# Patient Record
Sex: Female | Born: 1960 | Race: White | Hispanic: No | Marital: Married | Smoking: Never smoker
Health system: Southern US, Community
[De-identification: ages and names within clinical notes are randomized; demographics above are authoritative.]

## PROBLEM LIST (undated history)

## (undated) DIAGNOSIS — M797 Fibromyalgia: Secondary | ICD-10-CM

## (undated) DIAGNOSIS — Z8489 Family history of other specified conditions: Secondary | ICD-10-CM

## (undated) DIAGNOSIS — K146 Glossodynia: Principal | ICD-10-CM

## (undated) DIAGNOSIS — N816 Rectocele: Secondary | ICD-10-CM

## (undated) DIAGNOSIS — K37 Unspecified appendicitis: Secondary | ICD-10-CM

## (undated) DIAGNOSIS — Z87442 Personal history of urinary calculi: Secondary | ICD-10-CM

## (undated) DIAGNOSIS — E039 Hypothyroidism, unspecified: Secondary | ICD-10-CM

## (undated) DIAGNOSIS — F909 Attention-deficit hyperactivity disorder, unspecified type: Secondary | ICD-10-CM

## (undated) DIAGNOSIS — F32A Depression, unspecified: Secondary | ICD-10-CM

## (undated) DIAGNOSIS — F419 Anxiety disorder, unspecified: Secondary | ICD-10-CM

## (undated) DIAGNOSIS — M1990 Unspecified osteoarthritis, unspecified site: Secondary | ICD-10-CM

## (undated) DIAGNOSIS — G43009 Migraine without aura, not intractable, without status migrainosus: Secondary | ICD-10-CM

## (undated) DIAGNOSIS — K219 Gastro-esophageal reflux disease without esophagitis: Secondary | ICD-10-CM

## (undated) DIAGNOSIS — N189 Chronic kidney disease, unspecified: Secondary | ICD-10-CM

## (undated) DIAGNOSIS — E782 Mixed hyperlipidemia: Secondary | ICD-10-CM

## (undated) HISTORY — DX: Glossodynia: K14.6

## (undated) HISTORY — DX: Anxiety disorder, unspecified: F41.9

## (undated) HISTORY — DX: Unspecified appendicitis: K37

## (undated) HISTORY — DX: Chronic kidney disease, unspecified: N18.9

## (undated) HISTORY — DX: Fibromyalgia: M79.7

## (undated) HISTORY — PX: LITHOTRIPSY: SUR834

## (undated) HISTORY — DX: Unspecified osteoarthritis, unspecified site: M19.90

## (undated) HISTORY — PX: APPENDECTOMY: SHX54

## (undated) HISTORY — DX: Migraine without aura, not intractable, without status migrainosus: G43.009

## (undated) HISTORY — DX: Rectocele: N81.6

---

## 1997-12-12 ENCOUNTER — Inpatient Hospital Stay (HOSPITAL_COMMUNITY): Admission: AD | Admit: 1997-12-12 | Discharge: 1997-12-13 | Payer: Self-pay | Admitting: Gynecology

## 1998-04-28 ENCOUNTER — Other Ambulatory Visit: Admission: RE | Admit: 1998-04-28 | Discharge: 1998-04-28 | Payer: Self-pay | Admitting: Gynecology

## 1998-04-29 ENCOUNTER — Other Ambulatory Visit: Admission: RE | Admit: 1998-04-29 | Discharge: 1998-04-29 | Payer: Self-pay | Admitting: Gynecology

## 1998-05-22 ENCOUNTER — Ambulatory Visit (HOSPITAL_COMMUNITY): Admission: RE | Admit: 1998-05-22 | Discharge: 1998-05-22 | Payer: Self-pay | Admitting: Gynecology

## 1999-05-01 ENCOUNTER — Other Ambulatory Visit: Admission: RE | Admit: 1999-05-01 | Discharge: 1999-05-01 | Payer: Self-pay | Admitting: Gynecology

## 1999-06-15 ENCOUNTER — Ambulatory Visit (HOSPITAL_COMMUNITY): Admission: RE | Admit: 1999-06-15 | Discharge: 1999-06-15 | Payer: Self-pay | Admitting: Gynecology

## 1999-06-15 ENCOUNTER — Encounter: Payer: Self-pay | Admitting: Gynecology

## 1999-11-25 ENCOUNTER — Encounter: Payer: Self-pay | Admitting: Sports Medicine

## 1999-11-25 ENCOUNTER — Ambulatory Visit (HOSPITAL_COMMUNITY): Admission: RE | Admit: 1999-11-25 | Discharge: 1999-11-25 | Payer: Self-pay | Admitting: Sports Medicine

## 1999-12-14 ENCOUNTER — Encounter: Admission: RE | Admit: 1999-12-14 | Discharge: 1999-12-14 | Payer: Self-pay | Admitting: Sports Medicine

## 1999-12-18 ENCOUNTER — Ambulatory Visit (HOSPITAL_COMMUNITY): Admission: RE | Admit: 1999-12-18 | Discharge: 1999-12-18 | Payer: Self-pay | Admitting: Sports Medicine

## 1999-12-18 ENCOUNTER — Encounter: Payer: Self-pay | Admitting: Sports Medicine

## 2000-05-02 ENCOUNTER — Ambulatory Visit (HOSPITAL_COMMUNITY): Admission: RE | Admit: 2000-05-02 | Discharge: 2000-05-02 | Payer: Self-pay | Admitting: Sports Medicine

## 2000-05-02 ENCOUNTER — Encounter: Payer: Self-pay | Admitting: Sports Medicine

## 2000-05-24 ENCOUNTER — Other Ambulatory Visit: Admission: RE | Admit: 2000-05-24 | Discharge: 2000-05-24 | Payer: Self-pay | Admitting: Gynecology

## 2000-06-17 ENCOUNTER — Encounter: Payer: Self-pay | Admitting: Gynecology

## 2000-06-17 ENCOUNTER — Ambulatory Visit (HOSPITAL_COMMUNITY): Admission: RE | Admit: 2000-06-17 | Discharge: 2000-06-17 | Payer: Self-pay | Admitting: Gynecology

## 2000-09-01 ENCOUNTER — Encounter: Payer: Self-pay | Admitting: Sports Medicine

## 2000-09-01 ENCOUNTER — Ambulatory Visit (HOSPITAL_COMMUNITY): Admission: RE | Admit: 2000-09-01 | Discharge: 2000-09-01 | Payer: Self-pay | Admitting: Sports Medicine

## 2000-09-06 ENCOUNTER — Ambulatory Visit (HOSPITAL_COMMUNITY): Admission: RE | Admit: 2000-09-06 | Discharge: 2000-09-06 | Payer: Self-pay | Admitting: Sports Medicine

## 2000-09-06 ENCOUNTER — Encounter: Payer: Self-pay | Admitting: Sports Medicine

## 2001-05-31 ENCOUNTER — Other Ambulatory Visit: Admission: RE | Admit: 2001-05-31 | Discharge: 2001-05-31 | Payer: Self-pay | Admitting: Gynecology

## 2001-06-20 ENCOUNTER — Ambulatory Visit (HOSPITAL_COMMUNITY): Admission: RE | Admit: 2001-06-20 | Discharge: 2001-06-20 | Payer: Self-pay | Admitting: Gynecology

## 2001-06-20 ENCOUNTER — Encounter: Payer: Self-pay | Admitting: Gynecology

## 2001-07-31 ENCOUNTER — Encounter: Payer: Self-pay | Admitting: Sports Medicine

## 2001-07-31 ENCOUNTER — Encounter: Admission: RE | Admit: 2001-07-31 | Discharge: 2001-07-31 | Payer: Self-pay | Admitting: Sports Medicine

## 2002-01-03 ENCOUNTER — Other Ambulatory Visit: Admission: RE | Admit: 2002-01-03 | Discharge: 2002-01-03 | Payer: Self-pay | Admitting: Gynecology

## 2002-01-08 ENCOUNTER — Encounter (INDEPENDENT_AMBULATORY_CARE_PROVIDER_SITE_OTHER): Payer: Self-pay | Admitting: Specialist

## 2002-01-08 ENCOUNTER — Ambulatory Visit (HOSPITAL_COMMUNITY): Admission: AD | Admit: 2002-01-08 | Discharge: 2002-01-08 | Payer: Self-pay | Admitting: Gynecology

## 2002-05-18 ENCOUNTER — Encounter: Payer: Self-pay | Admitting: Sports Medicine

## 2002-05-18 ENCOUNTER — Encounter: Admission: RE | Admit: 2002-05-18 | Discharge: 2002-05-18 | Payer: Self-pay | Admitting: Sports Medicine

## 2002-06-15 ENCOUNTER — Ambulatory Visit (HOSPITAL_COMMUNITY): Admission: RE | Admit: 2002-06-15 | Discharge: 2002-06-15 | Payer: Self-pay | Admitting: Gynecology

## 2002-06-15 ENCOUNTER — Encounter: Payer: Self-pay | Admitting: Gynecology

## 2002-07-17 ENCOUNTER — Ambulatory Visit (HOSPITAL_COMMUNITY): Admission: RE | Admit: 2002-07-17 | Discharge: 2002-07-17 | Payer: Self-pay | Admitting: Internal Medicine

## 2002-07-23 ENCOUNTER — Encounter: Payer: Self-pay | Admitting: Gynecology

## 2002-07-23 ENCOUNTER — Ambulatory Visit (HOSPITAL_COMMUNITY): Admission: RE | Admit: 2002-07-23 | Discharge: 2002-07-23 | Payer: Self-pay | Admitting: Gynecology

## 2003-01-08 ENCOUNTER — Other Ambulatory Visit: Admission: RE | Admit: 2003-01-08 | Discharge: 2003-01-08 | Payer: Self-pay | Admitting: Gynecology

## 2003-07-24 ENCOUNTER — Inpatient Hospital Stay (HOSPITAL_COMMUNITY): Admission: AD | Admit: 2003-07-24 | Discharge: 2003-07-26 | Payer: Self-pay | Admitting: Gynecology

## 2003-09-11 ENCOUNTER — Other Ambulatory Visit: Admission: RE | Admit: 2003-09-11 | Discharge: 2003-09-11 | Payer: Self-pay | Admitting: Gynecology

## 2004-09-15 ENCOUNTER — Other Ambulatory Visit: Admission: RE | Admit: 2004-09-15 | Discharge: 2004-09-15 | Payer: Self-pay | Admitting: Gynecology

## 2004-11-17 ENCOUNTER — Ambulatory Visit (HOSPITAL_COMMUNITY): Admission: RE | Admit: 2004-11-17 | Discharge: 2004-11-17 | Payer: Self-pay | Admitting: Gynecology

## 2004-12-30 ENCOUNTER — Ambulatory Visit (HOSPITAL_COMMUNITY): Admission: RE | Admit: 2004-12-30 | Discharge: 2004-12-30 | Payer: Self-pay | Admitting: Sports Medicine

## 2005-09-20 ENCOUNTER — Other Ambulatory Visit: Admission: RE | Admit: 2005-09-20 | Discharge: 2005-09-20 | Payer: Self-pay | Admitting: Gynecology

## 2006-09-26 ENCOUNTER — Other Ambulatory Visit: Admission: RE | Admit: 2006-09-26 | Discharge: 2006-09-26 | Payer: Self-pay | Admitting: Gynecology

## 2006-09-29 ENCOUNTER — Ambulatory Visit (HOSPITAL_COMMUNITY): Admission: RE | Admit: 2006-09-29 | Discharge: 2006-09-29 | Payer: Self-pay | Admitting: Gynecology

## 2007-10-04 ENCOUNTER — Ambulatory Visit (HOSPITAL_COMMUNITY): Admission: RE | Admit: 2007-10-04 | Discharge: 2007-10-04 | Payer: Self-pay | Admitting: Gynecology

## 2007-10-05 ENCOUNTER — Other Ambulatory Visit: Admission: RE | Admit: 2007-10-05 | Discharge: 2007-10-05 | Payer: Self-pay | Admitting: Gynecology

## 2007-11-10 ENCOUNTER — Ambulatory Visit (HOSPITAL_BASED_OUTPATIENT_CLINIC_OR_DEPARTMENT_OTHER): Admission: RE | Admit: 2007-11-10 | Discharge: 2007-11-10 | Payer: Self-pay | Admitting: Gynecology

## 2007-11-10 ENCOUNTER — Encounter: Payer: Self-pay | Admitting: Gynecology

## 2008-07-11 ENCOUNTER — Ambulatory Visit: Payer: Self-pay | Admitting: Gynecology

## 2008-07-12 ENCOUNTER — Ambulatory Visit: Payer: Self-pay | Admitting: Gynecology

## 2008-10-09 ENCOUNTER — Encounter: Payer: Self-pay | Admitting: Gynecology

## 2008-10-09 ENCOUNTER — Other Ambulatory Visit: Admission: RE | Admit: 2008-10-09 | Discharge: 2008-10-09 | Payer: Self-pay | Admitting: Gynecology

## 2008-10-09 ENCOUNTER — Ambulatory Visit: Payer: Self-pay | Admitting: Gynecology

## 2008-10-15 ENCOUNTER — Ambulatory Visit: Payer: Self-pay | Admitting: Gynecology

## 2008-10-29 ENCOUNTER — Ambulatory Visit (HOSPITAL_COMMUNITY): Admission: RE | Admit: 2008-10-29 | Discharge: 2008-10-29 | Payer: Self-pay | Admitting: Gynecology

## 2008-10-29 ENCOUNTER — Ambulatory Visit: Payer: Self-pay | Admitting: Gynecology

## 2008-12-10 ENCOUNTER — Ambulatory Visit: Payer: Self-pay | Admitting: Gynecology

## 2008-12-13 ENCOUNTER — Ambulatory Visit (HOSPITAL_COMMUNITY): Admission: RE | Admit: 2008-12-13 | Discharge: 2008-12-13 | Payer: Self-pay | Admitting: Family Medicine

## 2009-01-21 ENCOUNTER — Ambulatory Visit: Payer: Self-pay | Admitting: Gynecology

## 2009-04-09 ENCOUNTER — Ambulatory Visit: Payer: Self-pay | Admitting: Gynecology

## 2009-07-04 ENCOUNTER — Ambulatory Visit: Payer: Self-pay | Admitting: Gynecology

## 2009-07-08 ENCOUNTER — Ambulatory Visit: Payer: Self-pay | Admitting: Gynecology

## 2009-08-12 ENCOUNTER — Ambulatory Visit: Payer: Self-pay | Admitting: Gynecology

## 2009-08-15 ENCOUNTER — Ambulatory Visit (HOSPITAL_BASED_OUTPATIENT_CLINIC_OR_DEPARTMENT_OTHER): Admission: RE | Admit: 2009-08-15 | Discharge: 2009-08-15 | Payer: Self-pay | Admitting: Gynecology

## 2009-08-15 ENCOUNTER — Encounter: Payer: Self-pay | Admitting: Gynecology

## 2009-08-15 ENCOUNTER — Ambulatory Visit: Payer: Self-pay | Admitting: Gynecology

## 2009-08-26 ENCOUNTER — Ambulatory Visit: Payer: Self-pay | Admitting: Gynecology

## 2009-10-14 ENCOUNTER — Other Ambulatory Visit: Admission: RE | Admit: 2009-10-14 | Discharge: 2009-10-14 | Payer: Self-pay | Admitting: Gynecology

## 2009-10-14 ENCOUNTER — Ambulatory Visit: Payer: Self-pay | Admitting: Gynecology

## 2009-10-21 ENCOUNTER — Ambulatory Visit: Payer: Self-pay | Admitting: Gynecology

## 2009-10-30 ENCOUNTER — Ambulatory Visit (HOSPITAL_COMMUNITY): Admission: RE | Admit: 2009-10-30 | Discharge: 2009-10-30 | Payer: Self-pay | Admitting: Gynecology

## 2009-11-06 ENCOUNTER — Encounter: Admission: RE | Admit: 2009-11-06 | Discharge: 2009-11-06 | Payer: Self-pay | Admitting: Gynecology

## 2009-11-20 ENCOUNTER — Encounter: Admission: RE | Admit: 2009-11-20 | Discharge: 2010-02-18 | Payer: Self-pay | Admitting: Obstetrics and Gynecology

## 2010-03-09 ENCOUNTER — Ambulatory Visit: Payer: Self-pay | Admitting: Sports Medicine

## 2010-03-09 DIAGNOSIS — Q667 Congenital pes cavus, unspecified foot: Secondary | ICD-10-CM | POA: Insufficient documentation

## 2010-03-09 DIAGNOSIS — M79609 Pain in unspecified limb: Secondary | ICD-10-CM | POA: Insufficient documentation

## 2010-03-09 DIAGNOSIS — S92309A Fracture of unspecified metatarsal bone(s), unspecified foot, initial encounter for closed fracture: Secondary | ICD-10-CM | POA: Insufficient documentation

## 2010-03-09 DIAGNOSIS — M7750 Other enthesopathy of unspecified foot: Secondary | ICD-10-CM | POA: Insufficient documentation

## 2010-07-29 ENCOUNTER — Emergency Department (HOSPITAL_COMMUNITY): Admission: EM | Admit: 2010-07-29 | Discharge: 2010-07-30 | Payer: Self-pay | Admitting: Emergency Medicine

## 2010-07-31 ENCOUNTER — Emergency Department (HOSPITAL_COMMUNITY): Admission: EM | Admit: 2010-07-31 | Discharge: 2010-08-01 | Payer: Self-pay | Admitting: Emergency Medicine

## 2010-08-03 ENCOUNTER — Ambulatory Visit (HOSPITAL_BASED_OUTPATIENT_CLINIC_OR_DEPARTMENT_OTHER): Admission: RE | Admit: 2010-08-03 | Discharge: 2010-08-03 | Payer: Self-pay | Admitting: Urology

## 2010-08-10 ENCOUNTER — Ambulatory Visit (HOSPITAL_COMMUNITY): Admission: RE | Admit: 2010-08-10 | Discharge: 2010-08-10 | Payer: Self-pay | Admitting: Urology

## 2010-08-10 ENCOUNTER — Emergency Department (HOSPITAL_COMMUNITY): Admission: EM | Admit: 2010-08-10 | Discharge: 2010-08-10 | Payer: Self-pay | Admitting: Emergency Medicine

## 2010-11-04 ENCOUNTER — Ambulatory Visit (HOSPITAL_COMMUNITY)
Admission: RE | Admit: 2010-11-04 | Discharge: 2010-11-04 | Payer: Self-pay | Source: Home / Self Care | Attending: Gynecology | Admitting: Gynecology

## 2010-11-22 ENCOUNTER — Encounter: Payer: Self-pay | Admitting: Sports Medicine
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
## 2011-01-14 LAB — COMPREHENSIVE METABOLIC PANEL
Albumin: 4.5 g/dL (ref 3.5–5.2)
BUN: 15 mg/dL (ref 6–23)
Calcium: 8.8 mg/dL (ref 8.4–10.5)
Creatinine, Ser: 1.01 mg/dL (ref 0.4–1.2)
Glucose, Bld: 101 mg/dL — ABNORMAL HIGH (ref 70–99)
Total Protein: 7.1 g/dL (ref 6.0–8.3)

## 2011-01-14 LAB — PREGNANCY, URINE: Preg Test, Ur: NEGATIVE

## 2011-01-14 LAB — DIFFERENTIAL
Basophils Relative: 0 % (ref 0–1)
Lymphocytes Relative: 35 % (ref 12–46)
Lymphs Abs: 3.4 10*3/uL (ref 0.7–4.0)
Monocytes Absolute: 0.6 10*3/uL (ref 0.1–1.0)
Monocytes Relative: 6 % (ref 3–12)
Neutro Abs: 5.5 10*3/uL (ref 1.7–7.7)
Neutrophils Relative %: 56 % (ref 43–77)

## 2011-01-14 LAB — URINALYSIS, ROUTINE W REFLEX MICROSCOPIC
Bilirubin Urine: NEGATIVE
Glucose, UA: NEGATIVE mg/dL
Ketones, ur: NEGATIVE mg/dL
Nitrite: NEGATIVE
Nitrite: POSITIVE — AB
Protein, ur: NEGATIVE mg/dL
Urobilinogen, UA: 0.2 mg/dL (ref 0.0–1.0)
pH: 7 (ref 5.0–8.0)

## 2011-01-14 LAB — CBC
HCT: 39.7 % (ref 36.0–46.0)
MCHC: 33.8 g/dL (ref 30.0–36.0)
MCV: 94.9 fL (ref 78.0–100.0)
Platelets: 310 10*3/uL (ref 150–400)
RDW: 13.3 % (ref 11.5–15.5)
WBC: 9.8 10*3/uL (ref 4.0–10.5)

## 2011-01-14 LAB — POCT PREGNANCY, URINE: Preg Test, Ur: NEGATIVE

## 2011-01-14 LAB — ABO/RH: ABO/RH(D): O POS

## 2011-01-14 LAB — TYPE AND SCREEN
ABO/RH(D): O POS
Antibody Screen: NEGATIVE

## 2011-01-14 LAB — URINE MICROSCOPIC-ADD ON

## 2011-03-16 NOTE — Op Note (Signed)
NAMECHRISTIANE, Angela Good                 ACCOUNT NO.:  [id number]   MEDICAL RECORD NO.:  [id number]           PATIENT TYPE:   LOCATION:                                 FACILITY:   PHYSICIAN:  Timothy P. Fontaine, M.D.DATE OF BIRTH:  February 02, 1961   DATE OF PROCEDURE:  11/10/2007  DATE OF DISCHARGE:                               OPERATIVE REPORT   [facility]   PREOPERATIVE DIAGNOSES:  Vulvar lesion.   POSTOPERATIVE DIAGNOSES:  Vulvar lesion.   PROCEDURE:  Wide local excision vulvar lesion.   SURGEON:  Timothy P. Fontaine, M.D.   ANESTHETIC:  General with 0.25% Marcaine local injection.   SPECIMEN:  Vulvar excisional biopsy sent to pathology.   ESTIMATED BLOOD LOSS:  Minimal.   COMPLICATIONS:  None.   FINDINGS:  External exam shows a plaque-like white-to-hyperpigmented  lesion lateral to the posterior fourchette on the right, approximately  10 mm in diameter, excised in its entirety.  Vaginal exam is normal.  Cervix grossly normal bimanual.  Uterus normal size, midline, and  mobile.  Adnexa without masses.   DESCRIPTION OF PROCEDURE:  The patient was taken to the operating room,  and underwent general anesthesia.  She was placed in the low-dorsal  lithotomy position, and received a perineal preparation with Betadine  solution.  EUA performed.  The patient draped in the usual fashion.   The abnormal area was clearly outlined, and subsequently the area was  excised in its entirety with a small portion of normal-appearing skin  surrounding it.  This was sent to pathology.  The subcutaneous tissues  were bovied for hemostasis, and the skin was reapproximated using 3-0  Vicryl in interrupted cutaneous stitches.  The area was subsequently  cleansed and the subcutaneous tissues were superficially injected using  0.25% Marcaine, approximately 8 mL total.  The patient was then placed  in the supine position, awakened without difficulty, and taken to  recovery room in good  condition having tolerated the procedure well.     Timothy P. Fontaine, M.D.  Electronically Signed    TPF/MEDQ  D:  11/10/2007  T:  11/10/2007  Job:  [id number]

## 2011-03-16 NOTE — H&P (Signed)
Angela Good, Angela Good                 ACCOUNT NO.:  [id number]   MEDICAL RECORD NO.:  [id number]          PATIENT TYPE:  AMB   LOCATION:  [facility]                         FACILITY:  [facility]   PHYSICIAN:  Timothy P. Fontaine, M.D.DATE OF BIRTH:  1961/05/28   DATE OF ADMISSION:  11/10/2007  DATE OF DISCHARGE:                              HISTORY & PHYSICAL   CHIEF COMPLAINT:  Vulvar lesion.   HISTORY OF PRESENT ILLNESS:  A 50 year old G74, P101, AB1 female, vasectomy  birth control, who presents for her annual exam early December with the  findings of a flat plaque-like area on the right perineal body.  The  patient has no history of this before, was unaware of its presence, it  is totally asymptomatic.  She underwent on vulvoscopy which confirmed an  acetowhite plaque-like change to the right of the posterior fourchette  with no other abnormalities noted.  She has no history of abnormal Pap  smears and her last Pap smear was early December 2008, which was  negative.  She is admitted at this time for excision of this area, rule  out carcinoma in situ.   PAST MEDICAL HISTORY:  Significant for fibromyalgia.   PAST SURGICAL HISTORY:  Appendectomy.   ALLERGIES:  No medications.   CURRENT MEDICATIONS:  Topamax, Cymbalta, doxycycline for acne, Xanax,  multivitamins per her medication reconciliation sheet.   REVIEW OF SYSTEMS:  Noncontributory.   FAMILY HISTORY:  Noncontributory.   SOCIAL HISTORY:  Noncontributory.   PHYSICAL EXAMINATION ON ADMISSION:  VITAL SIGNS:  Afebrile, vital signs  stable.  HEENT: Normal.  LUNGS:  Clear.  CARDIAC:  Regular rate without rubs, murmurs or gallops.  ABDOMEN:  Benign.  PELVIC:  External BUS, vagina with a whitish plaque-like raised area  right perineal body lateral to the posterior fourchette, approximately 5-  6 mm in length.  No other abnormalities noted.  Vagina grossly normal.  Cervix normal.  Bimanual uterus normal size, midline mobile, nontender.  Adnexa without masses or tenderness.   ASSESSMENT:  A 50 year old with new abnormal area on vulva suspicious  for vulvar intraepithelial neoplasia, differential including viral  changes, possible pigmented epithelial mole or polyp for excisional  biopsy.  The proposed surgery was discussed with the patient, the  expected intraoperative postoperative courses reviewed, the risks to  include bleeding, infection, possible wound breakdown, closure by  secondary intention, damage to surrounding structures such as vagina,  rectum, anal sphincter requiring further future reparative surgeries,  localized  pain perioperatively as well as the potential for long-term perineal  pain and dyspareunia was all discussed, understood and accepted.  The  various pathology possibilities to include cut-through lesions and the  possible need for re-excision was also discussed.  The patient's  questions were answered and she is ready to proceed with surgery.      Timothy P. Fontaine, M.D.  Electronically Signed     TPF/MEDQ  D:  10/31/2007  T:  10/31/2007  Job:  [id number]
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
## 2011-03-19 NOTE — H&P (Signed)
   NAME:  Angela Good, Angela Good                           ACCOUNT NO.:  [id number]   MEDICAL RECORD NO.:  [id number]                   PATIENT TYPE:  MAT   LOCATION:  [facility]                                 FACILITY:  [facility]   PHYSICIAN:  Timothy P. Fontaine, M.D.           DATE OF BIRTH:  03/26/1961   DATE OF ADMISSION:  07/24/2003  DATE OF DISCHARGE:                                HISTORY & PHYSICAL   CHIEF COMPLAINT:  1. Pregnancy at term.  2. Increasing musculoskeletal pain due to fibromyalgia.  3. Increasing pain due to right labial inguinal hernia.   HISTORY OF PRESENT ILLNESS:  A 50 year old G70, P2 female at [redacted] weeks  gestation. Favorable cervix at 3 cm, 80%, minus 2 station with increasing  musculoskeletal pain due to her history of fibromyalgia, worsening during  pregnancy as well as a right inguinal hernia which is causing increasing  discomfort. She is admitted for induction secondary to these complaints.  Prenatal course has otherwise been uncomplicated. For the remainder of her  history, see her Hollister.   PHYSICAL EXAMINATION:  HEENT: Normal.  LUNGS: Clear.  CARDIAC: Regular rate without rubs, murmurs, or gallops.  ABDOMEN: Gravid, vertex fetus. Appropriate for dates. Positive fetal heart  tones.  PELVIC: 3 cm, 80%, minus 2 station. Vertex presentation.   ASSESSMENT/PLAN:  Term pregnancy, increasing musculoskeletal pain, and  increasing pain from hernia, for induction. The patient does have a history  of positive beta strep in the past. Will cover with Penicillin protocol  antibiotic. Begin on Pitocin induction with AROM and subsequent delivery.  The plan was discussed with the patient who understands and accepts.                                               Timothy P. Audie Box, M.D.    TPF/MEDQ  D:  07/23/2003  T:  07/23/2003  Job:  [id number]

## 2011-03-19 NOTE — Discharge Summary (Signed)
   NAME:  Angela Good, Angela Good                           ACCOUNT NO.:  [id number]   MEDICAL RECORD NO.:  [id number]                   PATIENT TYPE:  INP   LOCATION:  9110                                 FACILITY:  [facility]   PHYSICIAN:  Ivor Costa. Farrel Gobble, M.D.              DATE OF BIRTH:  1961-05-06   DATE OF ADMISSION:  07/24/2003  DATE OF DISCHARGE:  07/26/2003                                 DISCHARGE SUMMARY   DISCHARGE DIAGNOSIS:  Intrauterine pregnancy at term for induction of labor  due to increasing musculoskeletal pain due to fibromyalgia and increased  pain due to right labial inguinal hernia.   PROCEDURE:  Spontaneous vaginal delivery without episiotomy of a viable  female.   HISTORY OF PRESENT ILLNESS:  A 50 year old gravida 4 para 2 at [redacted] weeks  gestation with a favorable cervix at 3 cm, 80% effaced, and -2 with  increasing musculoskeletal pain due to her history of fibromyalgia worsening  during her pregnancy as well as a right inguinal hernia which has caused  increased discomfort.  She is admitted for an induction secondary to these  complaints.  Prenatal course has been otherwise uncomplicated.   LABORATORY DATA:  Blood type is O positive, antibody negative.  Serology  nonreactive.  Rubella positive.  Hepatitis nonreactive.  HIV nonreactive.  Group B strep positive.   HOSPITAL COURSE AND TREATMENT:  The patient presented on July 24, 2003  for an induction of labor.  She progressed and had a spontaneous vaginal  delivery of a female with Apgars of 9 and 9 with a birth weight of 6 pounds  6 ounces.  Postpartum she remained afebrile with no difficulty voiding,  lochia decreasing, and she was discharged home in satisfactory condition on  postpartum day #2.   DISCHARGE LABORATORY DATA:  White count was 9.8, hemoglobin 12.1, hematocrit  34.3, and platelets 313.   DISPOSITION:  1. She was discharged home with instructions to follow up in six weeks or as     needed.  2.  Continue prenatal vitamins and iron.  3. Prescription for Tylox one to two q.4-6h. as needed for pain.  4. [facility] discharge booklet.     Davonna Belling. Young, N.P.                      Ivor Costa. Farrel Gobble, M.D.    [facility]  D:  08/26/2003  T:  08/26/2003  Job:  [id number]

## 2011-03-19 NOTE — Op Note (Signed)
[facility]  Patient:    Angela Good, Angela Good Visit Number: [id number] MRN: [id number]          Service Type: OBS Location: [facility] Attending Physician:  Tonye Royalty Dictated by:   Gaetano Hawthorne. Lily Peer, M.D. Proc. Date: 01/08/02 Admit Date:  01/08/2002                             Operative Report  SURGEON:                      Juan H. Lily Peer, M.D.  INDICATIONS FOR OPERATION:    A 50 year old gravida 3, para 2 at approximately 79 1/[redacted] weeks gestation with decreasing levels of quantitative beta hCG, increased abdominal cramping, and evidence of inevitable AB with vaginal bleeding.  PREOPERATIVE DIAGNOSES:       First trimester inevitable abortion.  POSTOPERATIVE DIAGNOSES:      First trimester inevitable abortion.  ANESTHESIA:                   MAC along with a paracervical block consisting of 2% lidocaine with 1:100,000 epinephrine.  PROCEDURE PERFORMED:          Dilatation and evacuation.  DESCRIPTION OF OPERATION:     After patient was adequately counseled she was taken to the operating room where she was placed in a low lithotomy position. The vagina and perineum were prepped and draped in the usual sterile fashion. A Red rubber Roxan Hockey was inserted into the bladder to evaluate its contents for approximately 100 cc.  At the time of placement of the speculum there was evidence of tissue present at the cervical os.  The vagina and cervical areas were cleansed with Betadine solution.  Pratt dilators were utilized to dilate the cervix and a 10 mm suction curette was inserted into the intrauterine cavity to remove remaining products of conception interchanged with serrated curette.  Pitocin drip was initiated.  Patient did receive 2 g of Cefotan. She did receive a paracervical block of 2% Xylocaine prior to commencing the operation consisting of 2% lidocaine with 1:100,000 epinephrine was infiltrated into the cervical stroma at the 2, 4, 8,  and 10 oclock positions along with MAC anesthesia.  She did well for the procedure and blood loss was less than 100 cc.  Fluid resuscitation consisted of 500 cc of lactated Ringers.  She is Rh positive and received 1 g Cefotan prophylactically. Dictated by:   Gaetano Hawthorne Lily Peer, M.D. Attending Physician:  Tonye Royalty DD:  01/08/02 TD:  01/09/02 Job: [id number]

## 2011-07-22 LAB — POCT PREGNANCY, URINE
Operator id: 114531
Preg Test, Ur: NEGATIVE

## 2011-07-22 LAB — POCT HEMOGLOBIN-HEMACUE: Hemoglobin: 13.7

## 2011-10-14 ENCOUNTER — Other Ambulatory Visit (HOSPITAL_COMMUNITY): Payer: Self-pay | Admitting: Obstetrics and Gynecology

## 2011-10-14 DIAGNOSIS — Z1231 Encounter for screening mammogram for malignant neoplasm of breast: Secondary | ICD-10-CM

## 2011-11-17 ENCOUNTER — Ambulatory Visit (HOSPITAL_COMMUNITY)
Admission: RE | Admit: 2011-11-17 | Discharge: 2011-11-17 | Disposition: A | Discharge status: Home / Self Care | Payer: BC Managed Care – PPO | Source: Ambulatory Visit | Attending: Obstetrics and Gynecology | Admitting: Obstetrics and Gynecology

## 2011-11-17 DIAGNOSIS — Z1231 Encounter for screening mammogram for malignant neoplasm of breast: Secondary | ICD-10-CM | POA: Insufficient documentation

## 2012-08-24 ENCOUNTER — Other Ambulatory Visit (HOSPITAL_COMMUNITY): Payer: Self-pay | Admitting: Obstetrics and Gynecology

## 2012-08-24 DIAGNOSIS — Z1231 Encounter for screening mammogram for malignant neoplasm of breast: Secondary | ICD-10-CM

## 2012-09-14 ENCOUNTER — Encounter: Payer: Self-pay | Admitting: Internal Medicine

## 2012-10-13 ENCOUNTER — Ambulatory Visit: Payer: BC Managed Care – PPO | Admitting: Internal Medicine

## 2012-11-22 ENCOUNTER — Ambulatory Visit (HOSPITAL_COMMUNITY): Payer: BC Managed Care – PPO

## 2012-12-01 ENCOUNTER — Ambulatory Visit (HOSPITAL_COMMUNITY)
Admission: RE | Admit: 2012-12-01 | Discharge: 2012-12-01 | Disposition: A | Discharge status: Home / Self Care | Payer: 59 | Source: Ambulatory Visit | Attending: Obstetrics and Gynecology | Admitting: Obstetrics and Gynecology

## 2012-12-01 ENCOUNTER — Other Ambulatory Visit: Payer: Self-pay | Admitting: Obstetrics and Gynecology

## 2012-12-01 DIAGNOSIS — Z1231 Encounter for screening mammogram for malignant neoplasm of breast: Secondary | ICD-10-CM | POA: Insufficient documentation

## 2012-12-16 ENCOUNTER — Other Ambulatory Visit: Payer: Self-pay

## 2013-06-29 ENCOUNTER — Encounter: Payer: Self-pay | Admitting: Internal Medicine

## 2013-08-06 ENCOUNTER — Ambulatory Visit: Payer: BC Managed Care – PPO | Admitting: Internal Medicine

## 2013-09-06 ENCOUNTER — Other Ambulatory Visit: Payer: Self-pay

## 2013-09-11 ENCOUNTER — Ambulatory Visit (INDEPENDENT_AMBULATORY_CARE_PROVIDER_SITE_OTHER): Payer: 59 | Admitting: Internal Medicine

## 2013-09-11 ENCOUNTER — Encounter: Payer: Self-pay | Admitting: Internal Medicine

## 2013-09-11 VITALS — BP 100/70 | HR 68 | Ht 62.0 in | Wt 126.8 lb

## 2013-09-11 DIAGNOSIS — R109 Unspecified abdominal pain: Secondary | ICD-10-CM

## 2013-09-11 DIAGNOSIS — Z1211 Encounter for screening for malignant neoplasm of colon: Secondary | ICD-10-CM

## 2013-09-11 DIAGNOSIS — R194 Change in bowel habit: Secondary | ICD-10-CM

## 2013-09-11 DIAGNOSIS — K219 Gastro-esophageal reflux disease without esophagitis: Secondary | ICD-10-CM

## 2013-09-11 DIAGNOSIS — R198 Other specified symptoms and signs involving the digestive system and abdomen: Secondary | ICD-10-CM

## 2013-09-11 MED ORDER — ESOMEPRAZOLE MAGNESIUM 40 MG PO CPDR: 40.0000 mg | DELAYED_RELEASE_CAPSULE | Freq: Two times a day (BID) | ORAL | Status: AC

## 2013-09-11 NOTE — Progress Notes (Signed)
HISTORY OF PRESENT ILLNESS:  Angela Good is a 52 y.o. female with chronic fibromyalgia, kidney stones, and anxiety. She presents herself today with chief complaint of burning tongue, chronic left-sided abdominal pain, and the need for colonoscopy. She has not been seen here many years. In September of 2003 she underwent colonoscopy to evaluate abdominal pain and change in bowel habits. Examination was completely normal. She continues to have problems with alternating bowel habits. She now states she has had worsening left lower quadrant pain. She has had this for years, though it seems to be seasonal. Fairly constant over the past few months. She h burning tongue sensation for greater than one year. She has had ear nose and throat as well as neurology evaluations without cause found or improvement. Currently on Topamax for this reason. Other complaints include hoarseness, bloating, reflux, and trouble swallowing. Interestingly, she denies having been on PPI. Her mother had esophageal cancer in her father had pancreatic cancer. Her weight has been stable. She is known to have fibromyalgia. No obvious exacerbating or relieving factors for discomfort  REVIEW OF SYSTEMS:  All non-GI ROS negative except for sinus trouble, anxiety, back pain, dry eyes, depression, fatigue, headaches, slight hearing problems, sore throat, swollen lymph glands   Past Medical History  Diagnosis Date  . Appendicitis   . Fibromyalgia   . Anxiety     Past Surgical History  Procedure Laterality Date  . Appendectomy      Social History Angela Good  reports that she has never smoked. She has never used smokeless tobacco. She reports that she does not drink alcohol or use illicit drugs.  family history includes Breast cancer in her sister; Esophageal cancer in her mother; Heart disease in her brother; Pancreatic cancer in her father.  Allergies  Allergen Reactions  . Erythromycin        PHYSICAL  EXAMINATION: Vital signs: BP 100/70  Pulse 68  Ht 5\' 2"  (1.575 m)  Wt 126 lb 12.8 oz (57.516 kg)  BMI 23.19 kg/m2  Constitutional: generally well-appearing, no acute distress Psychiatric: alert and oriented x3, cooperative. Eyes: extraocular movements intact, anicteric, conjunctiva pink. Mouth: oral pharynx moist, no lesions Neck: supple no lymphadenopathy Cardiovascular: heart regular rate and rhythm, no murmur. Lungs: clear to auscultation bilaterally Abdomen: soft, nontender, nondistended, no obvious ascites, no peritoneal signs, normal bowel sounds, no organomegaly. Prior surgical incisions well-healed Rectal: deferred until colonoscopy  Extremities: no lower extremity edema bilaterally. Skin: no lesions on visible extremities Neuro: No focal deficits.   ASSESSMENT:  #1. Chronic left-sided abdominal discomfort. Worse recently. Suspect musculoskeletal and/or related to IBS #2. Chronic alternating bowel habits, bloating, and abdominal discomfort consistent with IBS. #3. Possible GERD. Vague dysphagia. Family history of esophageal cancer. #4. Colon cancer screening. Due. #5. Burning tongue. Not obviously GI. ENT and neurology evaluations without resolution. Has not been on PPI (per patient)  PLAN:  #1. Nexium 40 mg daily. 30 days of samples provided as a trial see if any symptoms improve #2. Schedule upper endoscopy to evaluate vague dysphagia abdominal pain.The nature of the procedure, as well as the risks, benefits, and alternatives were carefully and thoroughly reviewed with the patient. Ample time for discussion and questions allowed. The patient understood, was satisfied, and agreed to proceed. #3. Schedule colonoscopy for colon cancer screening. Well, evaluate abdominal complaints.The nature of the procedure, as well as the risks, benefits, and alternatives were carefully and thoroughly reviewed with the patient. Ample time for discussion and questions allowed.  The patient  understood, was satisfied, and agreed to proceed.Prepopik prescribed #4. Continue general medical care with PCP and other specialists

## 2013-09-11 NOTE — Patient Instructions (Signed)
You have been scheduled for an endoscopy and colonoscopy with propofol. Please follow the written instructions given to you at your visit today. If you use inhalers (even only as needed), please bring them with you on the day of your procedure.

## 2013-09-12 ENCOUNTER — Ambulatory Visit: Payer: 59 | Attending: Obstetrics and Gynecology | Admitting: Physical Therapy

## 2013-09-12 DIAGNOSIS — M2420 Disorder of ligament, unspecified site: Secondary | ICD-10-CM | POA: Insufficient documentation

## 2013-09-12 DIAGNOSIS — M629 Disorder of muscle, unspecified: Secondary | ICD-10-CM | POA: Insufficient documentation

## 2013-09-12 DIAGNOSIS — IMO0001 Reserved for inherently not codable concepts without codable children: Secondary | ICD-10-CM | POA: Insufficient documentation

## 2013-09-13 ENCOUNTER — Telehealth: Payer: Self-pay

## 2013-09-13 NOTE — Telephone Encounter (Signed)
Left message for pt regarding changing the date of her procedure

## 2013-09-18 ENCOUNTER — Other Ambulatory Visit (HOSPITAL_COMMUNITY): Payer: Self-pay | Admitting: Obstetrics and Gynecology

## 2013-09-18 ENCOUNTER — Ambulatory Visit: Payer: 59 | Admitting: Physical Therapy

## 2013-09-18 DIAGNOSIS — Z1231 Encounter for screening mammogram for malignant neoplasm of breast: Secondary | ICD-10-CM

## 2013-09-20 ENCOUNTER — Ambulatory Visit: Payer: 59 | Admitting: Physical Therapy

## 2013-09-24 ENCOUNTER — Ambulatory Visit: Payer: 59 | Admitting: Physical Therapy

## 2013-10-01 ENCOUNTER — Ambulatory Visit: Payer: 59 | Attending: Obstetrics and Gynecology | Admitting: Physical Therapy

## 2013-10-01 DIAGNOSIS — M2420 Disorder of ligament, unspecified site: Secondary | ICD-10-CM | POA: Insufficient documentation

## 2013-10-01 DIAGNOSIS — IMO0001 Reserved for inherently not codable concepts without codable children: Secondary | ICD-10-CM | POA: Insufficient documentation

## 2013-10-01 DIAGNOSIS — M629 Disorder of muscle, unspecified: Secondary | ICD-10-CM | POA: Insufficient documentation

## 2013-10-03 ENCOUNTER — Ambulatory Visit: Payer: 59 | Admitting: Physical Therapy

## 2013-10-08 ENCOUNTER — Ambulatory Visit: Payer: 59 | Admitting: Physical Therapy

## 2013-10-10 ENCOUNTER — Ambulatory Visit: Payer: 59 | Admitting: Physical Therapy

## 2013-10-15 ENCOUNTER — Ambulatory Visit: Payer: 59 | Admitting: Physical Therapy

## 2013-10-17 ENCOUNTER — Ambulatory Visit: Payer: 59 | Admitting: Physical Therapy

## 2013-10-18 ENCOUNTER — Encounter: Payer: 59 | Admitting: Internal Medicine

## 2013-10-22 ENCOUNTER — Ambulatory Visit: Payer: 59 | Admitting: Physical Therapy

## 2013-10-29 ENCOUNTER — Encounter: Payer: 59 | Admitting: Internal Medicine

## 2013-10-29 ENCOUNTER — Telehealth: Payer: Self-pay | Admitting: Internal Medicine

## 2013-10-31 ENCOUNTER — Ambulatory Visit: Payer: 59 | Admitting: Physical Therapy

## 2013-11-02 ENCOUNTER — Ambulatory Visit: Payer: 59 | Attending: Obstetrics and Gynecology | Admitting: Physical Therapy

## 2013-11-02 DIAGNOSIS — M629 Disorder of muscle, unspecified: Secondary | ICD-10-CM | POA: Insufficient documentation

## 2013-11-02 DIAGNOSIS — IMO0001 Reserved for inherently not codable concepts without codable children: Secondary | ICD-10-CM | POA: Insufficient documentation

## 2013-11-02 DIAGNOSIS — M2420 Disorder of ligament, unspecified site: Secondary | ICD-10-CM | POA: Insufficient documentation

## 2013-12-06 ENCOUNTER — Other Ambulatory Visit: Payer: Self-pay | Admitting: Obstetrics and Gynecology

## 2013-12-06 ENCOUNTER — Ambulatory Visit (HOSPITAL_COMMUNITY)
Admission: RE | Admit: 2013-12-06 | Discharge: 2013-12-06 | Disposition: A | Discharge status: Home / Self Care | Payer: 59 | Source: Ambulatory Visit | Attending: Obstetrics and Gynecology | Admitting: Obstetrics and Gynecology

## 2013-12-06 ENCOUNTER — Ambulatory Visit (HOSPITAL_COMMUNITY): Payer: 59

## 2013-12-06 DIAGNOSIS — Z1231 Encounter for screening mammogram for malignant neoplasm of breast: Secondary | ICD-10-CM | POA: Insufficient documentation

## 2014-01-09 ENCOUNTER — Ambulatory Visit (AMBULATORY_SURGERY_CENTER): Payer: Self-pay | Admitting: *Deleted

## 2014-01-09 ENCOUNTER — Telehealth: Payer: Self-pay | Admitting: Internal Medicine

## 2014-01-09 VITALS — Ht 62.0 in | Wt 123.2 lb

## 2014-01-09 DIAGNOSIS — K219 Gastro-esophageal reflux disease without esophagitis: Secondary | ICD-10-CM

## 2014-01-09 DIAGNOSIS — Z1211 Encounter for screening for malignant neoplasm of colon: Secondary | ICD-10-CM

## 2014-01-09 NOTE — Progress Notes (Signed)
No egg or soy allergies  Pt had a prepopik sample given to her at her OV

## 2014-01-09 NOTE — Telephone Encounter (Signed)
Left message for pt to call back  °

## 2014-01-09 NOTE — Telephone Encounter (Signed)
Pt was scheduled for ECL earlier in December but had to cancel that appt. Pt called and wanted to know if she could get this done before the end of the month. Pt scheduled for previsit today and ECL in the [facility] tomorrow. Pt has only had a cup of coffee today. Instructed pt to have clear liquids the rest of the day and come for previsit at 3pm. Pt verbalized understanding.

## 2014-01-09 NOTE — Telephone Encounter (Signed)
Okay, thanks. By the way, she wanted Prepopik. Let previsit nurse know

## 2014-01-09 NOTE — Telephone Encounter (Signed)
Spoke with previsit nurse Belenda CruiseKristin and she is aware.

## 2014-01-10 ENCOUNTER — Ambulatory Visit (AMBULATORY_SURGERY_CENTER): Payer: 59 | Admitting: Internal Medicine

## 2014-01-10 ENCOUNTER — Encounter: Payer: Self-pay | Admitting: Internal Medicine

## 2014-01-10 VITALS — BP 109/81 | HR 63 | Temp 96.4°F | Resp 15 | Ht 62.0 in | Wt 123.0 lb

## 2014-01-10 DIAGNOSIS — R109 Unspecified abdominal pain: Secondary | ICD-10-CM

## 2014-01-10 DIAGNOSIS — Z1211 Encounter for screening for malignant neoplasm of colon: Secondary | ICD-10-CM

## 2014-01-10 DIAGNOSIS — R1310 Dysphagia, unspecified: Secondary | ICD-10-CM

## 2014-01-10 MED ORDER — SODIUM CHLORIDE 0.9 % IV SOLN: 500.0000 mL | INTRAVENOUS | Status: DC

## 2014-01-10 NOTE — Progress Notes (Signed)
Procedure ends, to recovery, report given and VSS. 

## 2014-01-10 NOTE — Op Note (Signed)
[facility] [address] [place], [id number]   COLONOSCOPY PROCEDURE REPORT  PATIENT: Angela EdisonlleAbbott Laboratoriesn, Jacara H.  MR#: [id number] BIRTHDATE: 18-Feb-1961 , 52  yrs. old GENDER: Female ENDOSCOPIST: Roxy CedarJohn N Moishy Laday Jr, MD REFERRED XB:JYNWGNBY:Office / Self PROCEDURE DATE:  01/10/2014 PROCEDURE:   Colonoscopy, screening First Screening Colonoscopy - Avg.  risk and is 50 yrs.  old or older - No.  Prior Negative Screening - Now for repeat screening. N/A  History of Adenoma - Now for follow-up colonoscopy & has been > or = to 3 yrs.  N/A  Polyps Removed Today? No.  Recommend repeat exam, <10 yrs? No. ASA CLASS:   Class II INDICATIONS:average risk screening. MEDICATIONS: MAC sedation, administered by CRNA, Propofol (Diprivan), and propofol (Diprivan) 220mg  IV  DESCRIPTION OF PROCEDURE:   After the risks benefits and alternatives of the procedure were thoroughly explained, informed consent was obtained.  A digital rectal exam revealed no abnormalities of the rectum.   The LB [id number]  endoscope was introduced through the anus and advanced to the cecum, which was identified by both the appendix and ileocecal valve. No adverse events experienced.   The quality of the prep was adequate, using MoviPrep  The instrument was then slowly withdrawn as the colon was fully examined.  COLON FINDINGS: The mucosa appeared normal in the terminal ileum. A normal appearing cecum, ileocecal valve, and appendiceal orifice were identified.  The ascending, hepatic flexure, transverse, splenic flexure, descending, sigmoid colon and rectum appeared unremarkable.  No polyps or cancers were seen.  Retroflexed views revealed no abnormalities. The time to cecum=6 minutes 0 seconds. Withdrawal time=10 minutes 31 seconds.  The scope was withdrawn and the procedure completed. COMPLICATIONS: There were no complications.  ENDOSCOPIC IMPRESSION: 1.   Normal mucosa in the terminal ileum 2.   Normal  colon  RECOMMENDATIONS: 1.  Continue current colorectal screening recommendations for "routine risk" patients with a repeat colonoscopy in 10 years. 2.  Upper endoscopy today (SEE REPORT)  eSigned:  Roxy CedarJohn N Ryshawn Sanzone Jr, MD 01/10/2014 10:35 AM   cc: Juline Patchichard Pang, MD and The Patient

## 2014-01-10 NOTE — Op Note (Signed)
[facility] [address] [place], [id number]   ENDOSCOPY PROCEDURE REPORT  PATIENT: Angela Good, Angela H.  MR#: [id number] BIRTHDATE: 1960/12/28 , 52  yrs. old GENDER: Female ENDOSCOPIST: Roxy CedarJohn N Torre Pikus Jr, MD REFERRED BY:  .  Self / Office PROCEDURE DATE:  01/10/2014 PROCEDURE:  EGD, diagnostic ASA CLASS:     Class II INDICATIONS:  Vague Dysphagia / Pharyngeal c/o ? GERD . no response to PPI. MEDICATIONS: MAC sedation, administered by CRNA and propofol (Diprivan) 80mg  IV TOPICAL ANESTHETIC: none  DESCRIPTION OF PROCEDURE: After the risks benefits and alternatives of the procedure were thoroughly explained, informed consent was obtained.  The LB [id number] endoscope was introduced through the mouth and advanced to the second portion of the duodenum. Without limitations.  The instrument was slowly withdrawn as the mucosa was fully examined.    EXAM:The upper, middle and distal third of the esophagus were carefully inspected and no abnormalities were noted.  The z-line was well seen at the GEJ.  The endoscope was pushed into the fundus which was normal including a retroflexed view.  The antrum, gastric body, first and second part of the duodenum were unremarkable. Retroflexed views revealed no abnormalities.     The scope was then withdrawn from the patient and the procedure completed.  COMPLICATIONS: There were no complications.  ENDOSCOPIC IMPRESSION: 1. Normal EGD  RECOMMENDATIONS: 1. Stop PPI 2. Return to the care of your primary provider  REPEAT EXAM:  eSigned:  Roxy CedarJohn N Atlas Crossland Jr, MD 01/10/2014 10:42 AM   FA:OZHYQMVCC:Richard Ricki MillerPang, MD and The Patient

## 2014-01-10 NOTE — Patient Instructions (Signed)

## 2014-01-11 ENCOUNTER — Telehealth: Payer: Self-pay | Admitting: *Deleted

## 2014-01-11 NOTE — Telephone Encounter (Signed)
Left message that we called for f/u 

## 2014-02-12 ENCOUNTER — Telehealth: Payer: Self-pay | Admitting: Internal Medicine

## 2014-02-12 NOTE — Telephone Encounter (Signed)
Pt aware.

## 2014-02-12 NOTE — Telephone Encounter (Signed)
Pt called and wanted to know if any biopsies were done during her ECL. Let pt know that the exams were completely normal and no biopsies were taken. Pt wanted me to check with Dr. Marina GoodellPerry to see if he has any idea what might be making the roof of her mouth burn and her tongue burn. She wants to know if this burning could be from a GI cause. Pt has multiple vague complaints. Please advise.

## 2014-02-12 NOTE — Telephone Encounter (Signed)
No GI cause for her symptoms found for suspected. Return to PCP for further evaluation

## 2015-11-14 ENCOUNTER — Ambulatory Visit: Payer: BLUE CROSS/BLUE SHIELD | Admitting: Neurology

## 2015-11-28 ENCOUNTER — Ambulatory Visit: Payer: BLUE CROSS/BLUE SHIELD | Admitting: Neurology

## 2016-01-03 MED FILL — AMITRIPTYLINE HCL 25 MG TAB: 25 | 30 days supply | Qty: 90 | Fill #1

## 2016-02-20 DIAGNOSIS — F4323 Adjustment disorder with mixed anxiety and depressed mood: Secondary | ICD-10-CM | POA: Diagnosis not present

## 2016-03-12 ENCOUNTER — Other Ambulatory Visit: Payer: Self-pay | Admitting: Obstetrics and Gynecology

## 2016-03-12 DIAGNOSIS — N644 Mastodynia: Secondary | ICD-10-CM

## 2016-03-12 DIAGNOSIS — Z1239 Encounter for other screening for malignant neoplasm of breast: Secondary | ICD-10-CM | POA: Diagnosis not present

## 2016-03-12 DIAGNOSIS — Z1212 Encounter for screening for malignant neoplasm of rectum: Secondary | ICD-10-CM | POA: Diagnosis not present

## 2016-03-12 DIAGNOSIS — Z6822 Body mass index (BMI) 22.0-22.9, adult: Secondary | ICD-10-CM | POA: Diagnosis not present

## 2016-03-12 DIAGNOSIS — Z01419 Encounter for gynecological examination (general) (routine) without abnormal findings: Secondary | ICD-10-CM | POA: Diagnosis not present

## 2016-03-12 DIAGNOSIS — E039 Hypothyroidism, unspecified: Secondary | ICD-10-CM | POA: Diagnosis not present

## 2016-03-15 ENCOUNTER — Ambulatory Visit (INDEPENDENT_AMBULATORY_CARE_PROVIDER_SITE_OTHER): Payer: BLUE CROSS/BLUE SHIELD | Admitting: Neurology

## 2016-03-15 ENCOUNTER — Encounter: Payer: Self-pay | Admitting: Neurology

## 2016-03-15 VITALS — BP 122/82 | HR 76 | Ht 62.5 in | Wt 125.5 lb

## 2016-03-15 DIAGNOSIS — K146 Glossodynia: Secondary | ICD-10-CM

## 2016-03-15 DIAGNOSIS — G43009 Migraine without aura, not intractable, without status migrainosus: Secondary | ICD-10-CM

## 2016-03-15 HISTORY — DX: Glossodynia: K14.6

## 2016-03-15 HISTORY — DX: Migraine without aura, not intractable, without status migrainosus: G43.009

## 2016-03-15 MED ORDER — GABAPENTIN 100 MG PO CAPS: ORAL_CAPSULE | ORAL | Status: DC

## 2016-03-15 MED FILL — GABAPENTIN 100 MG CAPSULE: 100 | 37 days supply | Qty: 180 | Fill #0
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
## 2016-03-26 ENCOUNTER — Ambulatory Visit
Admission: RE | Admit: 2016-03-26 | Discharge: 2016-03-26 | Disposition: A | Discharge status: Home / Self Care | Payer: BLUE CROSS/BLUE SHIELD | Source: Ambulatory Visit | Attending: Obstetrics and Gynecology | Admitting: Obstetrics and Gynecology

## 2016-03-26 ENCOUNTER — Ambulatory Visit
Admission: RE | Admit: 2016-03-26 | Discharge: 2016-03-26 | Disposition: A | Discharge status: Home / Self Care | Payer: 59 | Source: Ambulatory Visit | Attending: Obstetrics and Gynecology | Admitting: Obstetrics and Gynecology

## 2016-03-26 DIAGNOSIS — N644 Mastodynia: Secondary | ICD-10-CM | POA: Diagnosis not present

## 2016-04-29 DIAGNOSIS — F4323 Adjustment disorder with mixed anxiety and depressed mood: Secondary | ICD-10-CM | POA: Diagnosis not present

## 2016-04-30 DIAGNOSIS — F410 Panic disorder [episodic paroxysmal anxiety] without agoraphobia: Secondary | ICD-10-CM | POA: Diagnosis not present

## 2016-04-30 DIAGNOSIS — F4322 Adjustment disorder with anxiety: Secondary | ICD-10-CM | POA: Diagnosis not present

## 2016-05-05 ENCOUNTER — Telehealth: Payer: Self-pay | Admitting: Neurology

## 2016-05-05 NOTE — Telephone Encounter (Signed)
I agree with nursing assessment, the patient will contact our office if she is not getting better.

## 2016-05-05 NOTE — Telephone Encounter (Signed)
Pt called sts currently she is not taking Neurontin, she stopped taking it the middle of June, symptoms were better. She is trying holistic approach and has not had too much of an issue on the left side. She is waiting to see if the nerve pain starts back, then she will restart the neurontin and will call back to schedule appt. FYI

## 2016-05-05 NOTE — Telephone Encounter (Signed)
Pt called back requesting the nurse call her back she has questions. Thank you

## 2016-05-05 NOTE — Telephone Encounter (Signed)
Talked to pt. She reports that she tried gabapentin (taking only twice a day) and her left shoulder pain did get better. However, she decided to stop the medication a couple of weeks ago. Now, she is having left elbow pain, feeling as if it is "inflamed." Says that she has used heat and ice as well as Advil w/ little relief. Believes that her change in jobs and her kids being home from college has increased repetitive movement of L UE. She is left-handed per report. Has agreed to start back on gabapentin (at least twice a day as she forgets to take afternoon dose while at work). Will call back if symptoms do not improve and to reschedule her August appt.

## 2016-05-13 DIAGNOSIS — F4323 Adjustment disorder with mixed anxiety and depressed mood: Secondary | ICD-10-CM | POA: Diagnosis not present

## 2016-06-03 DIAGNOSIS — Z0000 Encounter for general adult medical examination without abnormal findings: Secondary | ICD-10-CM | POA: Diagnosis not present

## 2016-06-04 DIAGNOSIS — Z0000 Encounter for general adult medical examination without abnormal findings: Secondary | ICD-10-CM | POA: Diagnosis not present

## 2016-06-04 DIAGNOSIS — Z13220 Encounter for screening for lipoid disorders: Secondary | ICD-10-CM | POA: Diagnosis not present

## 2016-06-16 ENCOUNTER — Ambulatory Visit: Payer: BLUE CROSS/BLUE SHIELD | Admitting: Neurology

## 2016-07-02 DIAGNOSIS — F4322 Adjustment disorder with anxiety: Secondary | ICD-10-CM | POA: Diagnosis not present

## 2016-07-14 MED FILL — AMITRIPTYLINE HCL 25 MG TAB: 25 | 30 days supply | Qty: 90 | Fill #0

## 2016-09-20 MED FILL — GABAPENTIN 100 MG CAPSULE: 100 | 30 days supply | Qty: 180 | Fill #1

## 2016-10-06 MED FILL — ALCLOMETASONE DIPRO 0.05% C: 0.05 | 20 days supply | Qty: 45 | Fill #0

## 2016-10-29 DIAGNOSIS — F411 Generalized anxiety disorder: Secondary | ICD-10-CM | POA: Diagnosis not present

## 2016-10-29 DIAGNOSIS — F410 Panic disorder [episodic paroxysmal anxiety] without agoraphobia: Secondary | ICD-10-CM | POA: Diagnosis not present

## 2016-12-03 DIAGNOSIS — F411 Generalized anxiety disorder: Secondary | ICD-10-CM | POA: Diagnosis not present

## 2017-02-14 ENCOUNTER — Other Ambulatory Visit: Payer: Self-pay | Admitting: Family Medicine

## 2017-02-14 DIAGNOSIS — Z1231 Encounter for screening mammogram for malignant neoplasm of breast: Secondary | ICD-10-CM

## 2017-02-18 DIAGNOSIS — M5136 Other intervertebral disc degeneration, lumbar region: Secondary | ICD-10-CM | POA: Diagnosis not present

## 2017-02-18 DIAGNOSIS — F4323 Adjustment disorder with mixed anxiety and depressed mood: Secondary | ICD-10-CM | POA: Diagnosis not present

## 2017-02-18 DIAGNOSIS — M25512 Pain in left shoulder: Secondary | ICD-10-CM | POA: Diagnosis not present

## 2017-02-18 DIAGNOSIS — M5030 Other cervical disc degeneration, unspecified cervical region: Secondary | ICD-10-CM | POA: Diagnosis not present

## 2017-02-18 DIAGNOSIS — M25552 Pain in left hip: Secondary | ICD-10-CM | POA: Diagnosis not present

## 2017-02-18 DIAGNOSIS — M545 Low back pain: Secondary | ICD-10-CM | POA: Diagnosis not present

## 2017-02-23 DIAGNOSIS — L718 Other rosacea: Secondary | ICD-10-CM | POA: Diagnosis not present

## 2017-02-23 DIAGNOSIS — L821 Other seborrheic keratosis: Secondary | ICD-10-CM | POA: Diagnosis not present

## 2017-03-08 DIAGNOSIS — S63501A Unspecified sprain of right wrist, initial encounter: Secondary | ICD-10-CM | POA: Diagnosis not present

## 2017-04-01 ENCOUNTER — Ambulatory Visit: Payer: BLUE CROSS/BLUE SHIELD

## 2017-04-22 ENCOUNTER — Ambulatory Visit
Admission: RE | Admit: 2017-04-22 | Discharge: 2017-04-22 | Disposition: A | Discharge status: Home / Self Care | Payer: BLUE CROSS/BLUE SHIELD | Source: Ambulatory Visit | Attending: Family Medicine | Admitting: Family Medicine

## 2017-04-22 DIAGNOSIS — F3342 Major depressive disorder, recurrent, in full remission: Secondary | ICD-10-CM | POA: Diagnosis not present

## 2017-04-22 DIAGNOSIS — F411 Generalized anxiety disorder: Secondary | ICD-10-CM | POA: Diagnosis not present

## 2017-04-22 DIAGNOSIS — Z1231 Encounter for screening mammogram for malignant neoplasm of breast: Secondary | ICD-10-CM | POA: Diagnosis not present

## 2017-06-08 DIAGNOSIS — G43909 Migraine, unspecified, not intractable, without status migrainosus: Secondary | ICD-10-CM | POA: Diagnosis not present

## 2017-06-08 DIAGNOSIS — K219 Gastro-esophageal reflux disease without esophagitis: Secondary | ICD-10-CM | POA: Diagnosis not present

## 2017-06-08 DIAGNOSIS — Z0000 Encounter for general adult medical examination without abnormal findings: Secondary | ICD-10-CM | POA: Diagnosis not present

## 2017-06-08 DIAGNOSIS — Z13220 Encounter for screening for lipoid disorders: Secondary | ICD-10-CM | POA: Diagnosis not present

## 2017-06-08 DIAGNOSIS — M797 Fibromyalgia: Secondary | ICD-10-CM | POA: Diagnosis not present

## 2017-07-01 DIAGNOSIS — Z1212 Encounter for screening for malignant neoplasm of rectum: Secondary | ICD-10-CM | POA: Diagnosis not present

## 2017-07-01 DIAGNOSIS — Z6825 Body mass index (BMI) 25.0-25.9, adult: Secondary | ICD-10-CM | POA: Diagnosis not present

## 2017-07-01 DIAGNOSIS — Z01419 Encounter for gynecological examination (general) (routine) without abnormal findings: Secondary | ICD-10-CM | POA: Diagnosis not present

## 2017-10-07 DIAGNOSIS — F411 Generalized anxiety disorder: Secondary | ICD-10-CM | POA: Diagnosis not present

## 2017-10-07 DIAGNOSIS — F900 Attention-deficit hyperactivity disorder, predominantly inattentive type: Secondary | ICD-10-CM | POA: Diagnosis not present

## 2017-11-09 DIAGNOSIS — M25562 Pain in left knee: Secondary | ICD-10-CM | POA: Diagnosis not present

## 2017-11-09 DIAGNOSIS — S8392XA Sprain of unspecified site of left knee, initial encounter: Secondary | ICD-10-CM | POA: Diagnosis not present

## 2018-02-21 DIAGNOSIS — M7582 Other shoulder lesions, left shoulder: Secondary | ICD-10-CM | POA: Diagnosis not present

## 2018-02-21 DIAGNOSIS — M542 Cervicalgia: Secondary | ICD-10-CM | POA: Diagnosis not present

## 2018-02-23 DIAGNOSIS — M542 Cervicalgia: Secondary | ICD-10-CM | POA: Diagnosis not present

## 2018-02-23 DIAGNOSIS — M25512 Pain in left shoulder: Secondary | ICD-10-CM | POA: Diagnosis not present

## 2018-02-27 DIAGNOSIS — M25512 Pain in left shoulder: Secondary | ICD-10-CM | POA: Diagnosis not present

## 2018-02-27 DIAGNOSIS — M542 Cervicalgia: Secondary | ICD-10-CM | POA: Diagnosis not present

## 2018-03-20 ENCOUNTER — Other Ambulatory Visit: Payer: Self-pay | Admitting: Family Medicine

## 2018-03-20 DIAGNOSIS — Z1231 Encounter for screening mammogram for malignant neoplasm of breast: Secondary | ICD-10-CM

## 2018-03-21 DIAGNOSIS — B070 Plantar wart: Secondary | ICD-10-CM | POA: Diagnosis not present

## 2018-03-25 DIAGNOSIS — R51 Headache: Secondary | ICD-10-CM | POA: Diagnosis not present

## 2018-03-25 DIAGNOSIS — T1490XA Injury, unspecified, initial encounter: Secondary | ICD-10-CM | POA: Diagnosis not present

## 2018-03-25 DIAGNOSIS — M25561 Pain in right knee: Secondary | ICD-10-CM | POA: Diagnosis not present

## 2018-03-25 DIAGNOSIS — M25532 Pain in left wrist: Secondary | ICD-10-CM | POA: Diagnosis not present

## 2018-03-31 DIAGNOSIS — F3342 Major depressive disorder, recurrent, in full remission: Secondary | ICD-10-CM | POA: Diagnosis not present

## 2018-03-31 DIAGNOSIS — F900 Attention-deficit hyperactivity disorder, predominantly inattentive type: Secondary | ICD-10-CM | POA: Diagnosis not present

## 2018-04-03 DIAGNOSIS — B070 Plantar wart: Secondary | ICD-10-CM | POA: Diagnosis not present

## 2018-04-17 DIAGNOSIS — B070 Plantar wart: Secondary | ICD-10-CM | POA: Diagnosis not present

## 2018-04-24 ENCOUNTER — Ambulatory Visit
Admission: RE | Admit: 2018-04-24 | Discharge: 2018-04-24 | Disposition: A | Discharge status: Home / Self Care | Payer: BLUE CROSS/BLUE SHIELD | Source: Ambulatory Visit | Attending: Family Medicine | Admitting: Family Medicine

## 2018-04-24 DIAGNOSIS — Z1231 Encounter for screening mammogram for malignant neoplasm of breast: Secondary | ICD-10-CM | POA: Diagnosis not present

## 2018-05-31 DIAGNOSIS — J029 Acute pharyngitis, unspecified: Secondary | ICD-10-CM | POA: Diagnosis not present

## 2018-05-31 DIAGNOSIS — J329 Chronic sinusitis, unspecified: Secondary | ICD-10-CM | POA: Diagnosis not present

## 2018-05-31 DIAGNOSIS — J040 Acute laryngitis: Secondary | ICD-10-CM | POA: Diagnosis not present

## 2018-05-31 DIAGNOSIS — B9689 Other specified bacterial agents as the cause of diseases classified elsewhere: Secondary | ICD-10-CM | POA: Diagnosis not present

## 2018-07-05 DIAGNOSIS — M797 Fibromyalgia: Secondary | ICD-10-CM | POA: Diagnosis not present

## 2018-07-05 DIAGNOSIS — E039 Hypothyroidism, unspecified: Secondary | ICD-10-CM | POA: Diagnosis not present

## 2018-07-05 DIAGNOSIS — K219 Gastro-esophageal reflux disease without esophagitis: Secondary | ICD-10-CM | POA: Diagnosis not present

## 2018-07-05 DIAGNOSIS — E7800 Pure hypercholesterolemia, unspecified: Secondary | ICD-10-CM | POA: Diagnosis not present

## 2018-07-05 DIAGNOSIS — Z0000 Encounter for general adult medical examination without abnormal findings: Secondary | ICD-10-CM | POA: Diagnosis not present

## 2018-07-10 DIAGNOSIS — Z6826 Body mass index (BMI) 26.0-26.9, adult: Secondary | ICD-10-CM | POA: Diagnosis not present

## 2018-07-10 DIAGNOSIS — Z01419 Encounter for gynecological examination (general) (routine) without abnormal findings: Secondary | ICD-10-CM | POA: Diagnosis not present

## 2018-07-14 ENCOUNTER — Other Ambulatory Visit: Payer: Self-pay | Admitting: Obstetrics and Gynecology

## 2018-07-14 DIAGNOSIS — R221 Localized swelling, mass and lump, neck: Secondary | ICD-10-CM

## 2018-07-24 MED FILL — MAGIC MOUTHWASH BOP FORM: 6 days supply | Qty: 240 | Fill #0

## 2018-08-11 ENCOUNTER — Ambulatory Visit
Admission: RE | Admit: 2018-08-11 | Discharge: 2018-08-11 | Disposition: A | Discharge status: Home / Self Care | Payer: BLUE CROSS/BLUE SHIELD | Source: Ambulatory Visit | Attending: Obstetrics and Gynecology | Admitting: Obstetrics and Gynecology

## 2018-08-11 DIAGNOSIS — Z803 Family history of malignant neoplasm of breast: Secondary | ICD-10-CM | POA: Diagnosis not present

## 2018-08-11 DIAGNOSIS — E039 Hypothyroidism, unspecified: Secondary | ICD-10-CM | POA: Diagnosis not present

## 2018-08-11 DIAGNOSIS — Z800 Family history of malignant neoplasm of digestive organs: Secondary | ICD-10-CM | POA: Diagnosis not present

## 2018-08-11 DIAGNOSIS — Z13820 Encounter for screening for osteoporosis: Secondary | ICD-10-CM | POA: Diagnosis not present

## 2018-08-11 DIAGNOSIS — Z806 Family history of leukemia: Secondary | ICD-10-CM | POA: Diagnosis not present

## 2018-08-11 DIAGNOSIS — Z808 Family history of malignant neoplasm of other organs or systems: Secondary | ICD-10-CM | POA: Diagnosis not present

## 2018-08-11 DIAGNOSIS — R221 Localized swelling, mass and lump, neck: Secondary | ICD-10-CM

## 2018-09-01 DIAGNOSIS — E039 Hypothyroidism, unspecified: Secondary | ICD-10-CM | POA: Diagnosis not present

## 2018-09-15 DIAGNOSIS — F411 Generalized anxiety disorder: Secondary | ICD-10-CM | POA: Diagnosis not present

## 2018-09-15 DIAGNOSIS — F3342 Major depressive disorder, recurrent, in full remission: Secondary | ICD-10-CM | POA: Diagnosis not present

## 2018-09-15 DIAGNOSIS — F900 Attention-deficit hyperactivity disorder, predominantly inattentive type: Secondary | ICD-10-CM | POA: Diagnosis not present

## 2018-09-20 DIAGNOSIS — Z809 Family history of malignant neoplasm, unspecified: Secondary | ICD-10-CM | POA: Diagnosis not present

## 2018-11-15 DIAGNOSIS — S20211A Contusion of right front wall of thorax, initial encounter: Secondary | ICD-10-CM | POA: Diagnosis not present

## 2018-11-15 DIAGNOSIS — S83511A Sprain of anterior cruciate ligament of right knee, initial encounter: Secondary | ICD-10-CM | POA: Diagnosis not present

## 2018-11-21 DIAGNOSIS — M25561 Pain in right knee: Secondary | ICD-10-CM | POA: Diagnosis not present

## 2018-11-24 DIAGNOSIS — M25561 Pain in right knee: Secondary | ICD-10-CM | POA: Diagnosis not present

## 2018-11-29 DIAGNOSIS — M25661 Stiffness of right knee, not elsewhere classified: Secondary | ICD-10-CM | POA: Diagnosis not present

## 2018-11-29 DIAGNOSIS — M25561 Pain in right knee: Secondary | ICD-10-CM | POA: Diagnosis not present

## 2018-11-29 DIAGNOSIS — M6281 Muscle weakness (generalized): Secondary | ICD-10-CM | POA: Diagnosis not present

## 2018-12-06 DIAGNOSIS — M25661 Stiffness of right knee, not elsewhere classified: Secondary | ICD-10-CM | POA: Diagnosis not present

## 2018-12-06 DIAGNOSIS — M6281 Muscle weakness (generalized): Secondary | ICD-10-CM | POA: Diagnosis not present

## 2018-12-06 DIAGNOSIS — M25561 Pain in right knee: Secondary | ICD-10-CM | POA: Diagnosis not present

## 2018-12-08 DIAGNOSIS — M25561 Pain in right knee: Secondary | ICD-10-CM | POA: Diagnosis not present

## 2018-12-08 DIAGNOSIS — M25661 Stiffness of right knee, not elsewhere classified: Secondary | ICD-10-CM | POA: Diagnosis not present

## 2018-12-08 DIAGNOSIS — M6281 Muscle weakness (generalized): Secondary | ICD-10-CM | POA: Diagnosis not present

## 2018-12-11 DIAGNOSIS — M25561 Pain in right knee: Secondary | ICD-10-CM | POA: Diagnosis not present

## 2018-12-11 DIAGNOSIS — M6281 Muscle weakness (generalized): Secondary | ICD-10-CM | POA: Diagnosis not present

## 2018-12-11 DIAGNOSIS — M25661 Stiffness of right knee, not elsewhere classified: Secondary | ICD-10-CM | POA: Diagnosis not present

## 2018-12-13 DIAGNOSIS — M6281 Muscle weakness (generalized): Secondary | ICD-10-CM | POA: Diagnosis not present

## 2018-12-13 DIAGNOSIS — M25661 Stiffness of right knee, not elsewhere classified: Secondary | ICD-10-CM | POA: Diagnosis not present

## 2018-12-13 DIAGNOSIS — M25561 Pain in right knee: Secondary | ICD-10-CM | POA: Diagnosis not present

## 2018-12-18 DIAGNOSIS — M25561 Pain in right knee: Secondary | ICD-10-CM | POA: Diagnosis not present

## 2018-12-18 DIAGNOSIS — M25661 Stiffness of right knee, not elsewhere classified: Secondary | ICD-10-CM | POA: Diagnosis not present

## 2018-12-18 DIAGNOSIS — M6281 Muscle weakness (generalized): Secondary | ICD-10-CM | POA: Diagnosis not present

## 2018-12-22 DIAGNOSIS — M25661 Stiffness of right knee, not elsewhere classified: Secondary | ICD-10-CM | POA: Diagnosis not present

## 2018-12-22 DIAGNOSIS — M6281 Muscle weakness (generalized): Secondary | ICD-10-CM | POA: Diagnosis not present

## 2018-12-22 DIAGNOSIS — M25561 Pain in right knee: Secondary | ICD-10-CM | POA: Diagnosis not present

## 2018-12-28 DIAGNOSIS — M25561 Pain in right knee: Secondary | ICD-10-CM | POA: Diagnosis not present

## 2018-12-28 DIAGNOSIS — M6281 Muscle weakness (generalized): Secondary | ICD-10-CM | POA: Diagnosis not present

## 2018-12-28 DIAGNOSIS — M25661 Stiffness of right knee, not elsewhere classified: Secondary | ICD-10-CM | POA: Diagnosis not present

## 2019-01-01 MED FILL — MAGIC MOUTHWASH BOP FORM: 6 days supply | Qty: 240 | Fill #1

## 2019-03-02 DIAGNOSIS — F3342 Major depressive disorder, recurrent, in full remission: Secondary | ICD-10-CM | POA: Diagnosis not present

## 2019-03-02 DIAGNOSIS — F410 Panic disorder [episodic paroxysmal anxiety] without agoraphobia: Secondary | ICD-10-CM | POA: Diagnosis not present

## 2019-03-29 ENCOUNTER — Other Ambulatory Visit: Payer: Self-pay | Admitting: Family Medicine

## 2019-03-29 DIAGNOSIS — Z1231 Encounter for screening mammogram for malignant neoplasm of breast: Secondary | ICD-10-CM

## 2019-05-18 ENCOUNTER — Ambulatory Visit: Payer: BLUE CROSS/BLUE SHIELD

## 2019-06-09 MED FILL — MAGIC MOUTHWASH BOP FORM: 6 days supply | Qty: 240 | Fill #2

## 2019-06-27 DIAGNOSIS — N3001 Acute cystitis with hematuria: Secondary | ICD-10-CM | POA: Diagnosis not present

## 2019-06-27 DIAGNOSIS — N760 Acute vaginitis: Secondary | ICD-10-CM | POA: Diagnosis not present

## 2019-06-27 DIAGNOSIS — Z113 Encounter for screening for infections with a predominantly sexual mode of transmission: Secondary | ICD-10-CM | POA: Diagnosis not present

## 2019-06-27 DIAGNOSIS — R309 Painful micturition, unspecified: Secondary | ICD-10-CM | POA: Diagnosis not present

## 2019-06-29 ENCOUNTER — Other Ambulatory Visit: Payer: Self-pay

## 2019-06-29 ENCOUNTER — Ambulatory Visit
Admission: RE | Admit: 2019-06-29 | Discharge: 2019-06-29 | Disposition: A | Discharge status: Home / Self Care | Payer: BC Managed Care – PPO | Source: Ambulatory Visit | Attending: Family Medicine | Admitting: Family Medicine

## 2019-06-29 DIAGNOSIS — Z1231 Encounter for screening mammogram for malignant neoplasm of breast: Secondary | ICD-10-CM | POA: Diagnosis not present

## 2019-07-03 ENCOUNTER — Other Ambulatory Visit: Payer: Self-pay | Admitting: Family Medicine

## 2019-07-03 DIAGNOSIS — R928 Other abnormal and inconclusive findings on diagnostic imaging of breast: Secondary | ICD-10-CM

## 2019-07-05 ENCOUNTER — Other Ambulatory Visit: Payer: Self-pay

## 2019-07-05 ENCOUNTER — Ambulatory Visit
Admission: RE | Admit: 2019-07-05 | Discharge: 2019-07-05 | Disposition: A | Discharge status: Home / Self Care | Payer: BC Managed Care – PPO | Source: Ambulatory Visit | Attending: Family Medicine | Admitting: Family Medicine

## 2019-07-05 ENCOUNTER — Other Ambulatory Visit: Payer: Self-pay | Admitting: Family Medicine

## 2019-07-05 DIAGNOSIS — R928 Other abnormal and inconclusive findings on diagnostic imaging of breast: Secondary | ICD-10-CM

## 2019-07-05 DIAGNOSIS — N6489 Other specified disorders of breast: Secondary | ICD-10-CM

## 2019-07-19 DIAGNOSIS — E039 Hypothyroidism, unspecified: Secondary | ICD-10-CM | POA: Diagnosis not present

## 2019-07-19 DIAGNOSIS — Z01419 Encounter for gynecological examination (general) (routine) without abnormal findings: Secondary | ICD-10-CM | POA: Diagnosis not present

## 2019-07-19 DIAGNOSIS — N952 Postmenopausal atrophic vaginitis: Secondary | ICD-10-CM | POA: Diagnosis not present

## 2019-07-19 DIAGNOSIS — Z6825 Body mass index (BMI) 25.0-25.9, adult: Secondary | ICD-10-CM | POA: Diagnosis not present

## 2019-09-05 DIAGNOSIS — E039 Hypothyroidism, unspecified: Secondary | ICD-10-CM | POA: Diagnosis not present

## 2019-09-05 DIAGNOSIS — F419 Anxiety disorder, unspecified: Secondary | ICD-10-CM | POA: Diagnosis not present

## 2019-09-05 DIAGNOSIS — M797 Fibromyalgia: Secondary | ICD-10-CM | POA: Diagnosis not present

## 2019-09-05 DIAGNOSIS — R5383 Other fatigue: Secondary | ICD-10-CM | POA: Diagnosis not present

## 2019-10-08 ENCOUNTER — Other Ambulatory Visit: Payer: Self-pay

## 2019-10-08 DIAGNOSIS — Z20822 Contact with and (suspected) exposure to covid-19: Secondary | ICD-10-CM

## 2019-10-09 LAB — NOVEL CORONAVIRUS, NAA: SARS-CoV-2, NAA: NOT DETECTED

## 2019-10-31 DIAGNOSIS — F410 Panic disorder [episodic paroxysmal anxiety] without agoraphobia: Secondary | ICD-10-CM | POA: Diagnosis not present

## 2019-10-31 DIAGNOSIS — F4322 Adjustment disorder with anxiety: Secondary | ICD-10-CM | POA: Diagnosis not present

## 2019-12-25 DIAGNOSIS — M5412 Radiculopathy, cervical region: Secondary | ICD-10-CM | POA: Diagnosis not present

## 2019-12-27 DIAGNOSIS — Z03818 Encounter for observation for suspected exposure to other biological agents ruled out: Secondary | ICD-10-CM | POA: Diagnosis not present

## 2019-12-27 DIAGNOSIS — Z20828 Contact with and (suspected) exposure to other viral communicable diseases: Secondary | ICD-10-CM | POA: Diagnosis not present

## 2020-01-04 ENCOUNTER — Other Ambulatory Visit: Payer: Self-pay

## 2020-01-04 ENCOUNTER — Ambulatory Visit: Payer: BC Managed Care – PPO

## 2020-01-04 ENCOUNTER — Other Ambulatory Visit: Payer: Self-pay | Admitting: Family Medicine

## 2020-01-04 ENCOUNTER — Ambulatory Visit
Admission: RE | Admit: 2020-01-04 | Discharge: 2020-01-04 | Disposition: A | Discharge status: Home / Self Care | Payer: BC Managed Care – PPO | Source: Ambulatory Visit | Attending: Family Medicine | Admitting: Family Medicine

## 2020-01-04 DIAGNOSIS — R928 Other abnormal and inconclusive findings on diagnostic imaging of breast: Secondary | ICD-10-CM | POA: Diagnosis not present

## 2020-01-04 DIAGNOSIS — N6489 Other specified disorders of breast: Secondary | ICD-10-CM

## 2020-01-23 DIAGNOSIS — R58 Hemorrhage, not elsewhere classified: Secondary | ICD-10-CM | POA: Diagnosis not present

## 2020-01-23 DIAGNOSIS — R109 Unspecified abdominal pain: Secondary | ICD-10-CM | POA: Diagnosis not present

## 2020-02-06 DIAGNOSIS — L718 Other rosacea: Secondary | ICD-10-CM | POA: Diagnosis not present

## 2020-02-06 DIAGNOSIS — L814 Other melanin hyperpigmentation: Secondary | ICD-10-CM | POA: Diagnosis not present

## 2020-02-06 DIAGNOSIS — L738 Other specified follicular disorders: Secondary | ICD-10-CM | POA: Diagnosis not present

## 2020-02-06 DIAGNOSIS — D692 Other nonthrombocytopenic purpura: Secondary | ICD-10-CM | POA: Diagnosis not present

## 2020-02-27 DIAGNOSIS — K219 Gastro-esophageal reflux disease without esophagitis: Secondary | ICD-10-CM | POA: Diagnosis not present

## 2020-02-27 DIAGNOSIS — E039 Hypothyroidism, unspecified: Secondary | ICD-10-CM | POA: Diagnosis not present

## 2020-02-27 DIAGNOSIS — E7800 Pure hypercholesterolemia, unspecified: Secondary | ICD-10-CM | POA: Diagnosis not present

## 2020-02-27 DIAGNOSIS — Z0000 Encounter for general adult medical examination without abnormal findings: Secondary | ICD-10-CM | POA: Diagnosis not present

## 2020-07-11 ENCOUNTER — Other Ambulatory Visit: Payer: Self-pay

## 2020-07-11 ENCOUNTER — Ambulatory Visit
Admission: RE | Admit: 2020-07-11 | Discharge: 2020-07-11 | Disposition: A | Discharge status: Home / Self Care | Payer: BC Managed Care – PPO | Source: Ambulatory Visit | Attending: Family Medicine | Admitting: Family Medicine

## 2020-07-11 DIAGNOSIS — R928 Other abnormal and inconclusive findings on diagnostic imaging of breast: Secondary | ICD-10-CM | POA: Diagnosis not present

## 2020-07-11 DIAGNOSIS — N6489 Other specified disorders of breast: Secondary | ICD-10-CM

## 2020-07-18 IMAGING — MG DIGITAL SCREENING BILATERAL MAMMOGRAM WITH TOMO AND CAD
6 of 10 series · 6 of 30 positions shown · non-contrast
Comparison: Previous exam(s).

CLINICAL DATA: Screening.

EXAM:
DIGITAL SCREENING BILATERAL MAMMOGRAM WITH TOMO AND CAD

[R CC synth-2D (1 of 2)]
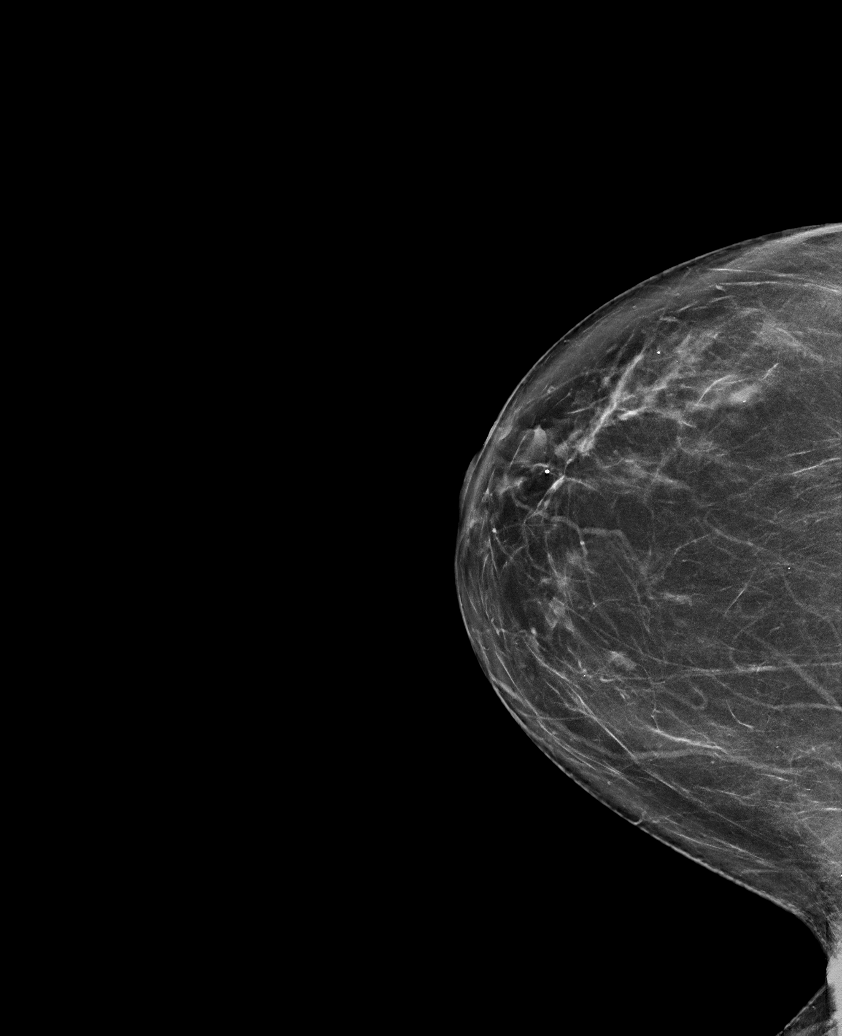

[L CC synth-2D]
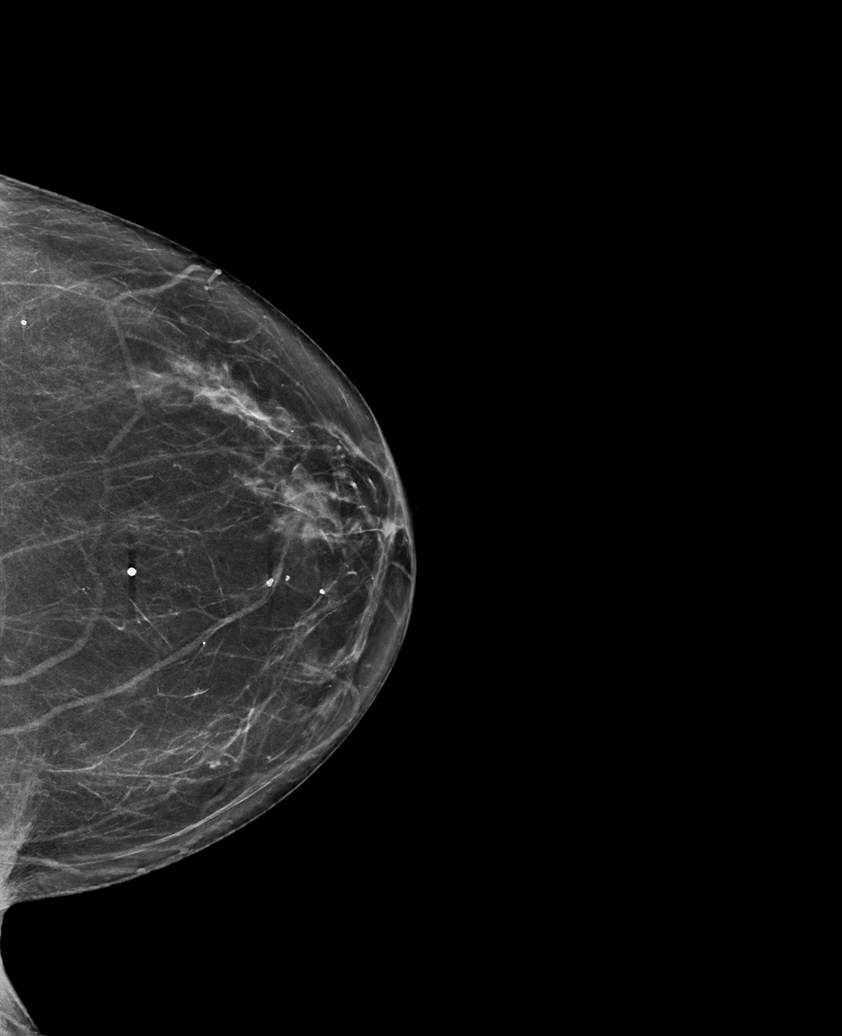

[L MLO synth-2D]
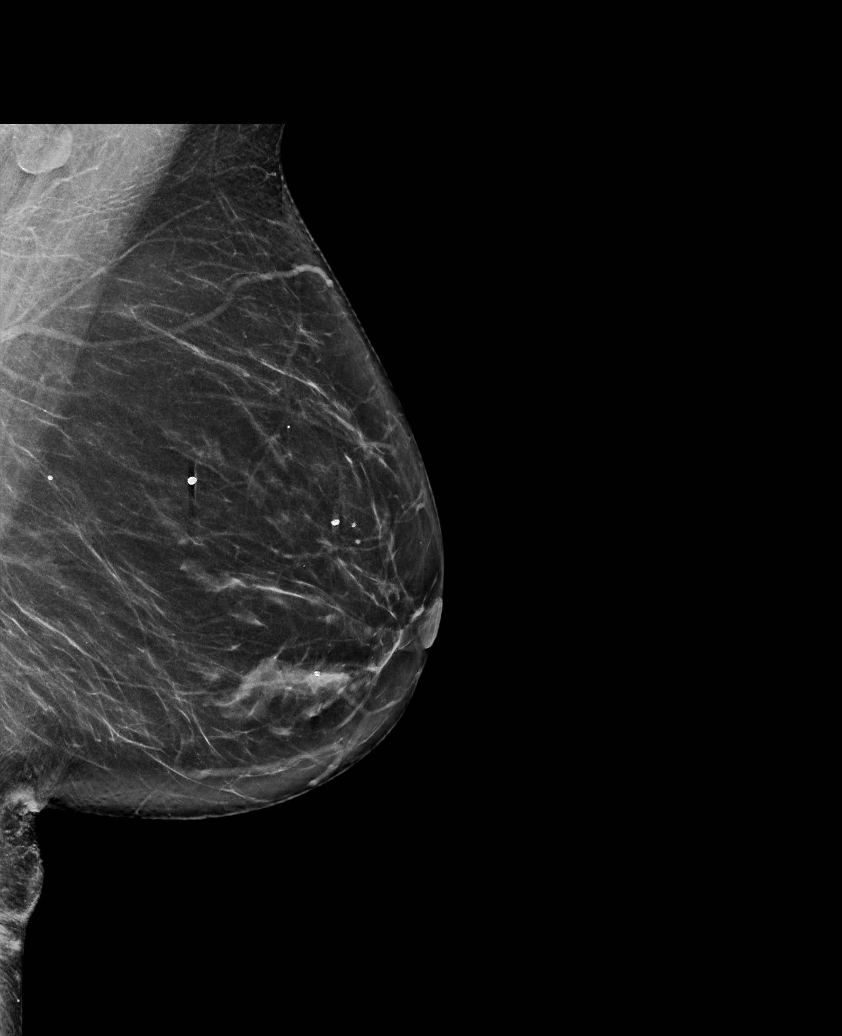

[R MLO synth-2D]
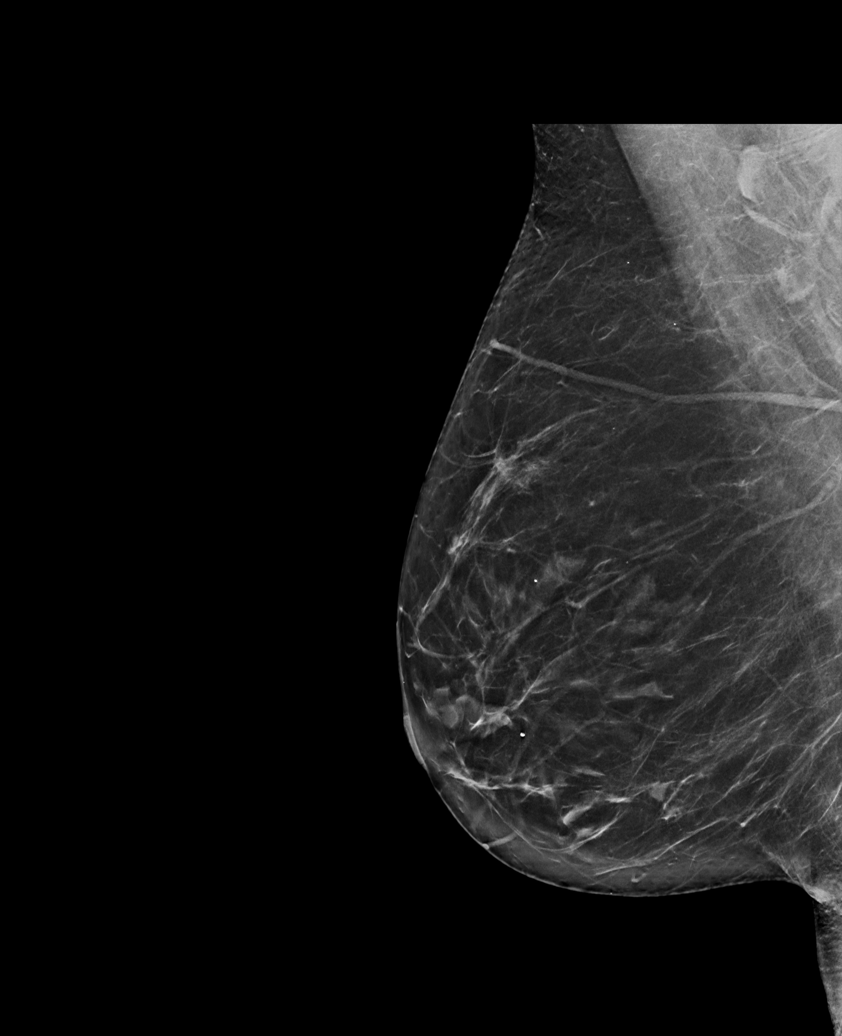

[R CC synth-2D (2 of 2)]
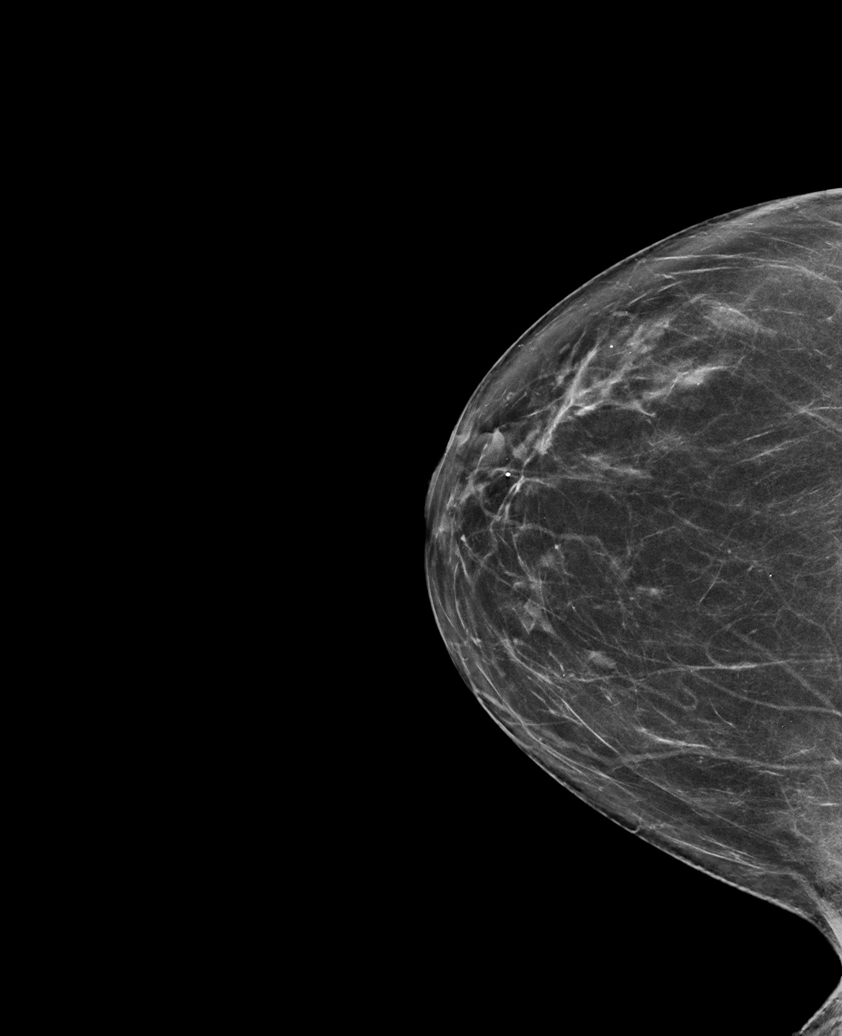

[L MLO tomo · tomo slice 41/81.0]
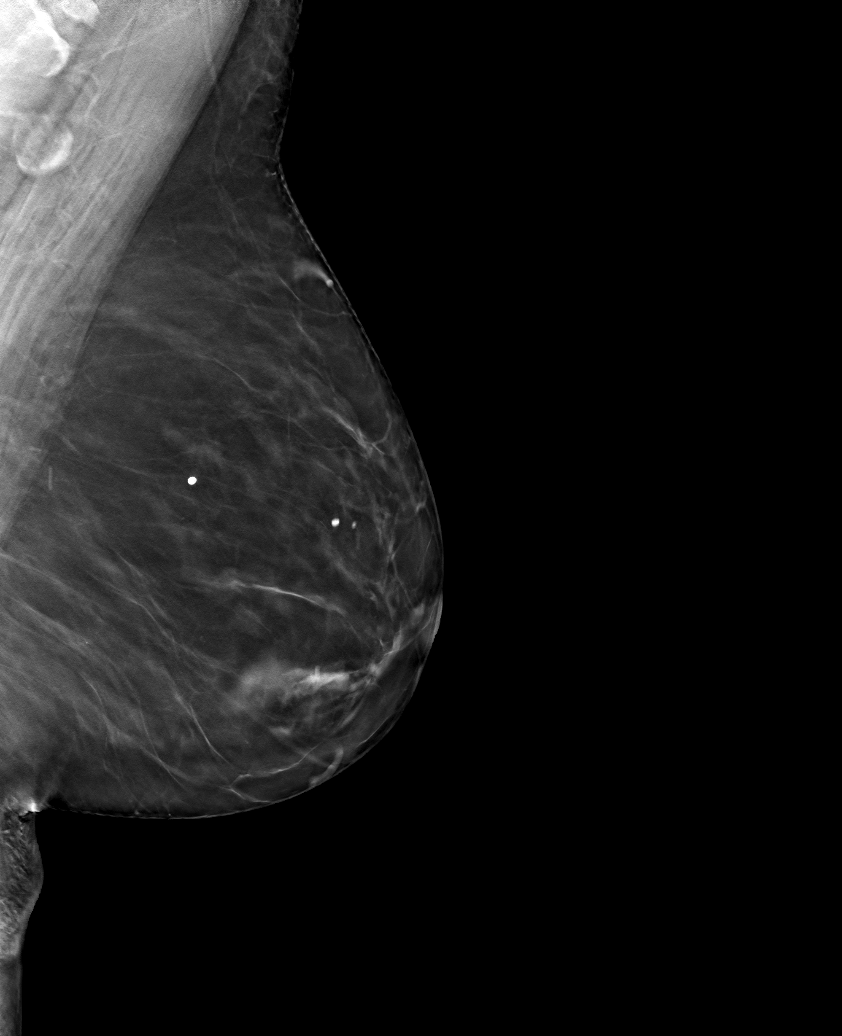

[6 of 30 positions shown; findings below may reference images not displayed]

ACR Breast Density Category c: The breast tissue is heterogeneously
dense, which may obscure small masses.
FINDINGS: In the right breast, a possible asymmetry warrants further
evaluation. In the left breast, no findings suspicious for
malignancy. Images were processed with CAD.
IMPRESSION: Further evaluation is suggested for possible asymmetry in the right
breast.

RECOMMENDATION:
Diagnostic mammogram and possibly ultrasound of the right breast.
(Code:EU-[id number])

The patient will be contacted regarding the findings, and additional
imaging will be scheduled.

BI-RADS CATEGORY  0: Incomplete. Need additional imaging evaluation
and/or prior mammograms for comparison.

## 2020-08-20 DIAGNOSIS — Z6827 Body mass index (BMI) 27.0-27.9, adult: Secondary | ICD-10-CM | POA: Diagnosis not present

## 2020-08-20 DIAGNOSIS — Z01419 Encounter for gynecological examination (general) (routine) without abnormal findings: Secondary | ICD-10-CM | POA: Diagnosis not present

## 2020-08-20 DIAGNOSIS — Z13820 Encounter for screening for osteoporosis: Secondary | ICD-10-CM | POA: Diagnosis not present

## 2020-08-22 DIAGNOSIS — M542 Cervicalgia: Secondary | ICD-10-CM | POA: Diagnosis not present

## 2020-08-22 DIAGNOSIS — M7582 Other shoulder lesions, left shoulder: Secondary | ICD-10-CM | POA: Diagnosis not present

## 2020-09-02 DIAGNOSIS — F411 Generalized anxiety disorder: Secondary | ICD-10-CM | POA: Diagnosis not present

## 2020-10-08 DIAGNOSIS — H40013 Open angle with borderline findings, low risk, bilateral: Secondary | ICD-10-CM | POA: Diagnosis not present

## 2020-11-15 DIAGNOSIS — Z1152 Encounter for screening for COVID-19: Secondary | ICD-10-CM | POA: Diagnosis not present

## 2020-11-21 DIAGNOSIS — Z1152 Encounter for screening for COVID-19: Secondary | ICD-10-CM | POA: Diagnosis not present

## 2020-11-25 ENCOUNTER — Other Ambulatory Visit: Payer: Self-pay | Admitting: Family Medicine

## 2020-11-25 ENCOUNTER — Ambulatory Visit
Admission: RE | Admit: 2020-11-25 | Discharge: 2020-11-25 | Disposition: A | Discharge status: Home / Self Care | Payer: BC Managed Care – PPO | Source: Ambulatory Visit | Attending: Family Medicine | Admitting: Family Medicine

## 2020-11-25 DIAGNOSIS — R0789 Other chest pain: Secondary | ICD-10-CM

## 2020-11-25 DIAGNOSIS — R0781 Pleurodynia: Secondary | ICD-10-CM | POA: Diagnosis not present

## 2021-06-18 ENCOUNTER — Other Ambulatory Visit: Payer: Self-pay | Admitting: Family Medicine

## 2021-06-18 DIAGNOSIS — N6489 Other specified disorders of breast: Secondary | ICD-10-CM

## 2021-07-28 ENCOUNTER — Other Ambulatory Visit: Payer: Self-pay | Admitting: Obstetrics and Gynecology

## 2021-07-29 ENCOUNTER — Other Ambulatory Visit: Payer: Self-pay

## 2021-07-29 ENCOUNTER — Ambulatory Visit
Admission: RE | Admit: 2021-07-29 | Discharge: 2021-07-29 | Disposition: A | Discharge status: Home / Self Care | Payer: BC Managed Care – PPO | Source: Ambulatory Visit | Attending: Family Medicine | Admitting: Family Medicine

## 2021-07-29 ENCOUNTER — Ambulatory Visit
Admission: RE | Admit: 2021-07-29 | Discharge: 2021-07-29 | Disposition: A | Discharge status: Home / Self Care | Payer: 59 | Source: Ambulatory Visit | Attending: Family Medicine | Admitting: Family Medicine

## 2021-07-29 ENCOUNTER — Other Ambulatory Visit: Payer: Self-pay | Admitting: Family Medicine

## 2021-07-29 DIAGNOSIS — N6489 Other specified disorders of breast: Secondary | ICD-10-CM

## 2021-07-31 ENCOUNTER — Ambulatory Visit
Admission: RE | Admit: 2021-07-31 | Discharge: 2021-07-31 | Disposition: A | Discharge status: Home / Self Care | Payer: 59 | Source: Ambulatory Visit | Attending: Family Medicine | Admitting: Family Medicine

## 2021-07-31 ENCOUNTER — Other Ambulatory Visit: Payer: Self-pay

## 2021-07-31 DIAGNOSIS — N6489 Other specified disorders of breast: Secondary | ICD-10-CM

## 2021-10-14 DIAGNOSIS — H40013 Open angle with borderline findings, low risk, bilateral: Secondary | ICD-10-CM | POA: Diagnosis not present

## 2022-01-27 DIAGNOSIS — Z0289 Encounter for other administrative examinations: Secondary | ICD-10-CM | POA: Diagnosis not present

## 2022-01-27 DIAGNOSIS — R030 Elevated blood-pressure reading, without diagnosis of hypertension: Secondary | ICD-10-CM | POA: Diagnosis not present

## 2022-01-27 DIAGNOSIS — M545 Low back pain, unspecified: Secondary | ICD-10-CM | POA: Diagnosis not present

## 2022-03-19 ENCOUNTER — Other Ambulatory Visit: Payer: Self-pay | Admitting: Family Medicine

## 2022-03-19 DIAGNOSIS — R5383 Other fatigue: Secondary | ICD-10-CM | POA: Diagnosis not present

## 2022-03-19 DIAGNOSIS — E7800 Pure hypercholesterolemia, unspecified: Secondary | ICD-10-CM | POA: Diagnosis not present

## 2022-03-19 DIAGNOSIS — R0789 Other chest pain: Secondary | ICD-10-CM | POA: Diagnosis not present

## 2022-03-19 DIAGNOSIS — F329 Major depressive disorder, single episode, unspecified: Secondary | ICD-10-CM | POA: Diagnosis not present

## 2022-03-19 DIAGNOSIS — H539 Unspecified visual disturbance: Secondary | ICD-10-CM

## 2022-03-19 DIAGNOSIS — E039 Hypothyroidism, unspecified: Secondary | ICD-10-CM | POA: Diagnosis not present

## 2022-03-19 DIAGNOSIS — F900 Attention-deficit hyperactivity disorder, predominantly inattentive type: Secondary | ICD-10-CM | POA: Diagnosis not present

## 2022-03-19 DIAGNOSIS — F411 Generalized anxiety disorder: Secondary | ICD-10-CM | POA: Diagnosis not present

## 2022-03-19 DIAGNOSIS — M797 Fibromyalgia: Secondary | ICD-10-CM | POA: Diagnosis not present

## 2022-03-26 ENCOUNTER — Ambulatory Visit
Admission: RE | Admit: 2022-03-26 | Discharge: 2022-03-26 | Disposition: A | Discharge status: Home / Self Care | Payer: BC Managed Care – PPO | Source: Ambulatory Visit | Attending: Family Medicine | Admitting: Family Medicine

## 2022-03-26 DIAGNOSIS — E785 Hyperlipidemia, unspecified: Secondary | ICD-10-CM | POA: Diagnosis not present

## 2022-03-26 DIAGNOSIS — H539 Unspecified visual disturbance: Secondary | ICD-10-CM | POA: Diagnosis not present

## 2022-04-02 DIAGNOSIS — D72829 Elevated white blood cell count, unspecified: Secondary | ICD-10-CM | POA: Diagnosis not present

## 2022-04-08 ENCOUNTER — Ambulatory Visit (INDEPENDENT_AMBULATORY_CARE_PROVIDER_SITE_OTHER): Payer: BC Managed Care – PPO | Admitting: Cardiovascular Disease

## 2022-04-08 ENCOUNTER — Encounter: Payer: Self-pay | Admitting: Cardiovascular Disease

## 2022-04-08 VITALS — BP 114/82 | HR 86 | Ht 62.5 in | Wt 147.2 lb

## 2022-04-08 DIAGNOSIS — R072 Precordial pain: Secondary | ICD-10-CM

## 2022-04-08 DIAGNOSIS — E782 Mixed hyperlipidemia: Secondary | ICD-10-CM | POA: Diagnosis not present

## 2022-04-08 MED ORDER — METOPROLOL TARTRATE 100 MG PO TABS: ORAL_TABLET | ORAL | 0 refills | Status: DC

## 2022-04-08 NOTE — Patient Instructions (Signed)
Medication Instructions:  No changes *If you need a refill on your cardiac medications before your next appointment, please call your pharmacy*  Follow-Up: At [facility], you and your health needs are our priority.  As part of our continuing mission to provide you with exceptional heart care, we have created designated Provider Care Teams.  These Care Teams include your primary Cardiologist (physician) and Advanced Practice Providers (APPs -  Physician Assistants and Nurse Practitioners) who all work together to provide you with the care you need, when you need it.  We recommend signing up for the patient portal called "MyChart".  Sign up information is provided on this After Visit Summary.  MyChart is used to connect with patients for Virtual Visits (Telemedicine).  Patients are able to view lab/test results, encounter notes, upcoming appointments, etc.  Non-urgent messages can be sent to your provider as well.   To learn more about what you can do with MyChart, go to [email or web address].    Your next appointment:   Pending results of the CT   Other Instructions   Your cardiac CT will be scheduled at one of the below locations:   [facility] [address] [place], [place] [address] [phone]  OR  [facility] [address] [place], [place] [address] [phone]  If scheduled at [facility], please arrive at the [facility] Entrance (Entrance C2) of [name] 30 minutes prior to test start time. You can use the FREE valet parking offered at entrance C (encouraged to control the heart rate for the test)  Proceed to the [facility] Radiology Department (first floor) to check-in and test prep.  All radiology patients and guests should use entrance C2 at [facility], accessed from [facility], even though the hospital's physical address listed is [address].    If scheduled at [facility], please arrive 15 mins early for check-in and test prep.  Please follow these instructions carefully (unless otherwise directed):  On the Night Before the Test: Be sure to Drink plenty of water. Do not consume any caffeinated/decaffeinated beverages or chocolate 12 hours prior to your test. Do not take any antihistamines 12 hours prior to your test.  On the Day of the Test: Drink plenty of water until 1 hour prior to the test. Do not eat any food 4 hours prior to the test. You may take your regular medications prior to the test.  Take metoprolol (Lopressor) two hours prior to test. FEMALES- please wear underwire-free bra if available, avoid dresses & tight clothing      After the Test: Drink plenty of water. After receiving IV contrast, you may experience a mild flushed feeling. This is normal. On occasion, you may experience a mild rash up to 24 hours after the test. This is not dangerous. If this occurs, you can take Benadryl 25 mg and increase your fluid intake. If you experience trouble breathing, this can be serious. If it is severe call 911 IMMEDIATELY. If it is mild, please call our office. If you take any of these medications: Glipizide/Metformin, Avandament, Glucavance, please do not take 48 hours after completing test unless otherwise instructed.  We will call to schedule your test 2-4 weeks out understanding that some insurance companies will need an authorization prior to the service being performed.   For non-scheduling related questions, please contact the cardiac imaging nurse navigator should you have any  questions/concerns: Rockwell Alexandria, Cardiac Imaging Nurse Navigator Larey Brick, Cardiac Imaging Nurse Navigator [facility] Direct Office Dial: [phone]   For scheduling needs, including cancellations and rescheduling, please call [place], [phone].
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
## 2022-04-09 DIAGNOSIS — E782 Mixed hyperlipidemia: Secondary | ICD-10-CM | POA: Insufficient documentation

## 2022-04-28 ENCOUNTER — Other Ambulatory Visit (HOSPITAL_COMMUNITY): Payer: BC Managed Care – PPO

## 2022-04-29 ENCOUNTER — Telehealth (HOSPITAL_COMMUNITY): Payer: Self-pay | Admitting: Emergency Medicine

## 2022-04-29 NOTE — Telephone Encounter (Signed)
Reaching out to patient to offer assistance regarding upcoming cardiac imaging study; pt verbalizes understanding of appt date/time, parking situation and where to check in, pre-test NPO status and medications ordered, and verified current allergies; name and call back number provided for further questions should they arise Rockwell Alexandria RN Navigator Cardiac Imaging [facility] [phone] office [phone] cell  100mg  metoprolol tartrate  Denies iv issues  Arrival 300

## 2022-04-29 NOTE — Telephone Encounter (Signed)
Unable to leave vm Tywana Robotham RN Navigator Cardiac Imaging [facility] [phone] Office  [phone] Cell  

## 2022-04-30 ENCOUNTER — Ambulatory Visit (HOSPITAL_COMMUNITY)
Admission: RE | Admit: 2022-04-30 | Discharge: 2022-04-30 | Disposition: A | Discharge status: Home / Self Care | Payer: BC Managed Care – PPO | Source: Ambulatory Visit | Attending: Cardiovascular Disease | Admitting: Cardiovascular Disease

## 2022-04-30 DIAGNOSIS — R931 Abnormal findings on diagnostic imaging of heart and coronary circulation: Secondary | ICD-10-CM

## 2022-04-30 DIAGNOSIS — R072 Precordial pain: Secondary | ICD-10-CM | POA: Diagnosis not present

## 2022-04-30 HISTORY — DX: Abnormal findings on diagnostic imaging of heart and coronary circulation: R93.1

## 2022-04-30 MED ORDER — NITROGLYCERIN 0.4 MG SL SUBL
SUBLINGUAL_TABLET | SUBLINGUAL | Status: AC
  Filled 2022-04-30: qty 2

## 2022-04-30 MED ORDER — IOHEXOL 350 MG/ML SOLN
100.0000 mL | Freq: Once | INTRAVENOUS | Status: AC | PRN
  Administered 2022-04-30: 100 mL via INTRAVENOUS

## 2022-04-30 MED ORDER — NITROGLYCERIN 0.4 MG SL SUBL
0.8000 mg | SUBLINGUAL_TABLET | Freq: Once | SUBLINGUAL | Status: AC
  Administered 2022-04-30: 0.8 mg via SUBLINGUAL

## 2022-05-24 ENCOUNTER — Encounter: Payer: Self-pay | Admitting: Cardiovascular Disease

## 2022-05-24 ENCOUNTER — Other Ambulatory Visit: Payer: Self-pay

## 2022-05-24 ENCOUNTER — Telehealth: Payer: Self-pay | Admitting: Cardiovascular Disease

## 2022-05-24 DIAGNOSIS — E782 Mixed hyperlipidemia: Secondary | ICD-10-CM

## 2022-05-24 MED ORDER — ROSUVASTATIN CALCIUM 10 MG PO TABS: 10.0000 mg | ORAL_TABLET | Freq: Every day | ORAL | 6 refills | Status: DC

## 2022-05-24 NOTE — Telephone Encounter (Signed)
Spoke to patient coronary ct results given.Dr.Croitoru advised to start Crestor 10 mg daily.Repeat fasting lipid panel in 3 months.Lab order mailed. Patient read she has 2 nodules noted on right side.She wanted to ask Dr.Croitoru what that means.Advised I will send message to him for advice.

## 2022-05-24 NOTE — Telephone Encounter (Signed)
Pt c/o medication issue:  1. Name of Medication: Rosuvastatin (Crestor)  2. How are you currently taking this medication (dosage and times per day)?   3. Are you having a reaction (difficulty breathing--STAT)?   4. What is your medication issue? Pt states that when she saw Dr. Royann Shivers in office he stated he wanted to start her on the medication crestor, but it has not been called in and she does not see it added on her MyChart. Would like a call back for clarification.

## 2022-05-24 NOTE — Telephone Encounter (Signed)
Sent a patient message.

## 2022-05-31 NOTE — Telephone Encounter (Signed)
I think getting side effects from any cholesterol lowering medication after just one dose would be very surprising. The effects of the medication take weeks and months to kick in. I suspect there may be a different reason for the symptoms and it could have been a coincidence. Would she consider trying one more time? But if she prefers to try an alternative medication, please prescribe pravastatin 40 mg daily (it sounds like a bigger med dose, but this is a much weaker and shorter acting statin, the one least likely to cause side effects; it must be taken in the evening, with dinner r at bedtime)

## 2022-06-14 ENCOUNTER — Other Ambulatory Visit (HOSPITAL_COMMUNITY): Payer: Self-pay

## 2022-06-14 MED ORDER — PRAVASTATIN SODIUM 40 MG PO TABS
40.0000 mg | ORAL_TABLET | Freq: Every evening | ORAL | 3 refills | Status: DC
  Filled 2022-06-14: qty 90, 90d supply, fill #0

## 2022-06-14 NOTE — Telephone Encounter (Signed)
Please stop the rosuvastatin. Please wait for 3-4 weeks for all the symptoms to resolve completely. Then we can try an alternative agent that is weaker, but likely better tolerated: pravastatin 40 mg daily, must be taken in the evening time. If she has similar side effects with pravastatin, will try to see if insurance will cover Repatha or Praluent or Nexletol.

## 2022-06-15 ENCOUNTER — Other Ambulatory Visit (HOSPITAL_COMMUNITY): Payer: Self-pay

## 2022-06-17 ENCOUNTER — Other Ambulatory Visit: Payer: Self-pay | Admitting: Family Medicine

## 2022-06-17 DIAGNOSIS — Z1231 Encounter for screening mammogram for malignant neoplasm of breast: Secondary | ICD-10-CM

## 2022-07-20 DIAGNOSIS — Z0000 Encounter for general adult medical examination without abnormal findings: Secondary | ICD-10-CM | POA: Diagnosis not present

## 2022-07-20 DIAGNOSIS — M25511 Pain in right shoulder: Secondary | ICD-10-CM | POA: Diagnosis not present

## 2022-07-20 DIAGNOSIS — K219 Gastro-esophageal reflux disease without esophagitis: Secondary | ICD-10-CM | POA: Diagnosis not present

## 2022-07-20 DIAGNOSIS — E7800 Pure hypercholesterolemia, unspecified: Secondary | ICD-10-CM | POA: Diagnosis not present

## 2022-07-20 DIAGNOSIS — M797 Fibromyalgia: Secondary | ICD-10-CM | POA: Diagnosis not present

## 2022-08-03 ENCOUNTER — Other Ambulatory Visit (HOSPITAL_COMMUNITY): Payer: Self-pay

## 2022-08-03 MED ORDER — AMPHETAMINE-DEXTROAMPHETAMINE 20 MG PO TABS
20.0000 mg | ORAL_TABLET | Freq: Three times a day (TID) | ORAL | 0 refills | Status: DC
  Filled 2022-08-03: qty 90, 30d supply, fill #0

## 2022-08-04 ENCOUNTER — Other Ambulatory Visit (HOSPITAL_COMMUNITY): Payer: Self-pay

## 2022-08-04 ENCOUNTER — Ambulatory Visit
Admission: RE | Admit: 2022-08-04 | Discharge: 2022-08-04 | Disposition: A | Discharge status: Home / Self Care | Payer: BC Managed Care – PPO | Source: Ambulatory Visit | Attending: Family Medicine | Admitting: Family Medicine

## 2022-08-04 DIAGNOSIS — Z1231 Encounter for screening mammogram for malignant neoplasm of breast: Secondary | ICD-10-CM

## 2022-08-13 ENCOUNTER — Encounter: Payer: Self-pay | Admitting: Cardiovascular Disease

## 2022-08-13 DIAGNOSIS — E782 Mixed hyperlipidemia: Secondary | ICD-10-CM

## 2022-08-16 NOTE — Telephone Encounter (Signed)
Please stop pravastatin and start repatha 140 mg every 2 weeks

## 2022-08-18 DIAGNOSIS — H00014 Hordeolum externum left upper eyelid: Secondary | ICD-10-CM | POA: Diagnosis not present

## 2022-08-18 DIAGNOSIS — B349 Viral infection, unspecified: Secondary | ICD-10-CM | POA: Diagnosis not present

## 2022-08-19 DIAGNOSIS — R509 Fever, unspecified: Secondary | ICD-10-CM | POA: Diagnosis not present

## 2022-08-25 DIAGNOSIS — Z6825 Body mass index (BMI) 25.0-25.9, adult: Secondary | ICD-10-CM | POA: Diagnosis not present

## 2022-08-25 DIAGNOSIS — Z01419 Encounter for gynecological examination (general) (routine) without abnormal findings: Secondary | ICD-10-CM | POA: Diagnosis not present

## 2022-08-25 DIAGNOSIS — Z13820 Encounter for screening for osteoporosis: Secondary | ICD-10-CM | POA: Diagnosis not present

## 2022-08-27 ENCOUNTER — Other Ambulatory Visit (HOSPITAL_COMMUNITY): Payer: Self-pay

## 2022-08-30 ENCOUNTER — Other Ambulatory Visit (HOSPITAL_COMMUNITY): Payer: Self-pay

## 2022-08-30 MED ORDER — AMPHETAMINE-DEXTROAMPHETAMINE 20 MG PO TABS
20.0000 mg | ORAL_TABLET | Freq: Three times a day (TID) | ORAL | 0 refills | Status: DC
  Filled 2022-09-02: qty 90, 30d supply, fill #0

## 2022-08-31 DIAGNOSIS — E782 Mixed hyperlipidemia: Secondary | ICD-10-CM | POA: Diagnosis not present

## 2022-08-31 DIAGNOSIS — R072 Precordial pain: Secondary | ICD-10-CM | POA: Diagnosis not present

## 2022-08-31 LAB — BASIC METABOLIC PANEL
BUN/Creatinine Ratio: 22 (ref 12–28)
BUN: 15 mg/dL (ref 8–27)
CO2: 27 mmol/L (ref 20–29)
Calcium: 9.1 mg/dL (ref 8.7–10.3)
Chloride: 104 mmol/L (ref 96–106)
Creatinine, Ser: 0.68 mg/dL (ref 0.57–1.00)
Glucose: 102 mg/dL — ABNORMAL HIGH (ref 70–99)
Potassium: 5.1 mmol/L (ref 3.5–5.2)
Sodium: 143 mmol/L (ref 134–144)
eGFR: 99 mL/min/{1.73_m2} (ref >59)

## 2022-08-31 LAB — LIPID PANEL
Chol/HDL Ratio: 3.4 ratio (ref 0.0–4.4)
Cholesterol, Total: 230 mg/dL — ABNORMAL HIGH (ref 100–199)
HDL: 68 mg/dL (ref >39)
LDL Chol Calc (NIH): 143 mg/dL — ABNORMAL HIGH (ref 0–99)
Triglycerides: 107 mg/dL (ref 0–149)
VLDL Cholesterol Cal: 19 mg/dL (ref 5–40)

## 2022-09-03 ENCOUNTER — Other Ambulatory Visit (HOSPITAL_COMMUNITY): Payer: Self-pay

## 2022-09-08 DIAGNOSIS — F411 Generalized anxiety disorder: Secondary | ICD-10-CM | POA: Diagnosis not present

## 2022-09-08 DIAGNOSIS — F900 Attention-deficit hyperactivity disorder, predominantly inattentive type: Secondary | ICD-10-CM | POA: Diagnosis not present

## 2022-09-17 ENCOUNTER — Ambulatory Visit
Payer: BC Managed Care – PPO | Attending: Cardiology | Admitting: Pharmacist Clinician (PhC)/ Clinical Pharmacy Specialist

## 2022-09-17 ENCOUNTER — Encounter: Payer: Self-pay | Admitting: Pharmacist Clinician (PhC)/ Clinical Pharmacy Specialist

## 2022-09-17 DIAGNOSIS — E782 Mixed hyperlipidemia: Secondary | ICD-10-CM

## 2022-09-17 NOTE — Progress Notes (Unsigned)
09/17/2022 Angela Good [birth year] [id number]   HPI:  Angela Good is a 61 y.o. female patient of Dr Royann Shivers, who presents today for a lipid clinic evaluation.  See pertinent past medical history below.   She had coronary CT angiography because of ongoing issues with chest tightness as well as a family history of early onset CAD.    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    Current Medications:  none  Cholesterol Goals: LDL < 70   Intolerant/previously tried:  rosuvastatin, pravastatin -  myalgias       Family history: grandmother died at 71 MI, brother had MI at 57, another brother DES (late 33's), parents both died cancer; 3 children healthy  Diet: some eating out, about 25%, mostly sit down; home cooked is usually from scratch, some vegetables, but could do better, not a lot of red meat, more chicken and fish; not much for snacking;  does protein shake most mornings  Exercise:  has gym membership, but hasn't gone much lately, busy with work  Lipid Panel     Component Value Date/Time   CHOL 230 (H) 08/31/2022 0945   TRIG 107 08/31/2022 0945   HDL 68 08/31/2022 0945   CHOLHDL 3.4 08/31/2022 0945   LDLCALC 143 (H) 08/31/2022 0945   LABVLDL 19 08/31/2022 0945     Current Outpatient Medications  Medication Sig Dispense Refill   ALPRAZolam (XANAX) 1 MG tablet Take 1 mg by mouth at bedtime as needed for anxiety.     amphetamine-dextroamphetamine (ADDERALL) 20 MG tablet Take 20 mg by mouth in the morning and at bedtime.     amphetamine-dextroamphetamine (ADDERALL) 20 MG tablet Take 1 tablet (20 mg total) by mouth 3 (three) times daily. 90 tablet 0   celecoxib (CELEBREX) 200 MG capsule Take 200 mg by mouth daily as needed. (Patient not taking: Reported on 04/08/2022)     cyclobenzaprine (FLEXERIL) 10 MG  tablet Take 10 mg by mouth 3 (three) times daily as needed.     esomeprazole (NEXIUM) 40 MG capsule Take 1 capsule (40 mg total) by mouth 2 (two) times daily. (Patient not taking: Reported on 04/08/2022) 60 capsule 6   fluticasone (FLONASE) 50 MCG/ACT nasal spray      levothyroxine (SYNTHROID) 88 MCG tablet Take 88 mcg by mouth daily.     methocarbamol (ROBAXIN) 500 MG tablet Take 500 mg by mouth 3 (three) times daily as needed. (Patient not taking: Reported on 04/08/2022)     metoprolol tartrate (LOPRESSOR) 100 MG tablet Take one tablet two hours prior to the test 1 tablet 0   Prasterone (INTRAROSA) 6.5 MG INST Intrarosa 6.5 mg vaginal insert (Patient not taking: Reported on 04/08/2022)     Probiotic Product (ALIGN PO) Take 1 tablet by mouth daily in the afternoon.     SUMAtriptan Succinate (IMITREX PO) Take by mouth as needed. (Patient not taking: Reported on 04/08/2022)     No current facility-administered medications for this visit.    Allergies  Allergen Reactions   Augmentin [Amoxicillin-Pot Clavulanate] Nausea And Vomiting   Effexor [Venlafaxine]     Heart palpatations   Erythromycin     Bothers stomach    Latex     Cuts in hands   Lexapro [Escitalopram Oxalate]     Heart palpitations    Past Medical History:  Diagnosis Date   Anxiety    Appendicitis    Arthritis    left sided   Burning tongue syndrome 03/15/2016   Chronic kidney disease    hx kidney stones 2010, had stent put in   Common migraine without intractability 03/15/2016   Fibromyalgia    Rectocele     Last menstrual period 12/20/2013.   Mixed hyperlipidemia Patient with CAD and hyperlipidemia, currently not at LDL goal.  She has tried two different statin drugs but was unable to take because of myalgias.  Reviewed options for lowering LDL cholesterol, including ezetimibe, PCSK-9 inhibitors, bempedoic acid and inclisiran.  Discussed mechanisms of action, dosing, side effects and potential decreases in LDL  cholesterol.  Also reviewed cost information and potential options for patient assistance.  Answered all patient questions.  Based on this information, patient would prefer to start PCSK9 inhibitor.  Will send for PA and once approved, patient will do one injection every 14 days.  She will need to repeat lipid/liver labs after 3 months.     Phillips Hay PharmD CPP [facility] [address] [place], [place] [address] [phone]

## 2022-09-17 NOTE — Assessment & Plan Note (Signed)
Patient with CAD and hyperlipidemia, currently not at LDL goal.  She has tried two different statin drugs but was unable to take because of myalgias.  Reviewed options for lowering LDL cholesterol, including ezetimibe, PCSK-9 inhibitors, bempedoic acid and inclisiran.  Discussed mechanisms of action, dosing, side effects and potential decreases in LDL cholesterol.  Also reviewed cost information and potential options for patient assistance.  Answered all patient questions.  Based on this information, patient would prefer to start PCSK9 inhibitor.  Will send for PA and once approved, patient will do one injection every 14 days.  She will need to repeat lipid/liver labs after 3 months.

## 2022-09-17 NOTE — Patient Instructions (Addendum)
Your Results:             Your most recent labs Goal  Total Cholesterol 230 < 200  Triglycerides 107 < 150  HDL (happy/good cholesterol) 68 > 40  LDL (lousy/bad cholesterol 143 < 70   Medication changes:  We will start the process to get Repatha/Praluent covered by your insurance.  Once the prior authorization is complete, I will call you to let you know and confirm pharmacy information.   You will take one injection every 14 days  Lab orders:  We want to repeat labs after 2-3 months.  We will send you a lab order to remind you once we get closer to that time.    Please reach out to me Angela Good through your MyChart as soon as you know about any possible changes to your insurance (and when it will occur).    Thank you for choosing [facility]

## 2022-10-08 ENCOUNTER — Telehealth: Payer: Self-pay | Admitting: Cardiovascular Disease

## 2022-10-08 NOTE — Telephone Encounter (Signed)
Pt states she had an appt with Belenda Cruise RPH-CPP few weeks ago and wanted to let her know that her new insurance has started so she can go ahead and starting taking Repatha.

## 2022-10-12 ENCOUNTER — Encounter: Payer: Self-pay | Admitting: Pharmacist Clinician (PhC)/ Clinical Pharmacy Specialist

## 2022-10-12 NOTE — Telephone Encounter (Signed)
Patient is returning call.  °

## 2022-10-15 ENCOUNTER — Other Ambulatory Visit (HOSPITAL_COMMUNITY): Payer: Self-pay

## 2022-10-15 MED ORDER — REPATHA SURECLICK 140 MG/ML ~~LOC~~ SOAJ
140.0000 mg | SUBCUTANEOUS | 12 refills | Status: DC
  Filled 2022-10-15 – 2022-11-10 (×2): qty 2, 28d supply, fill #0
  Filled 2022-12-15: qty 2, 28d supply, fill #1
  Filled 2023-01-24: qty 2, 28d supply, fill #2
  Filled 2023-02-25: qty 2, 28d supply, fill #3
  Filled 2023-04-27: qty 2, 28d supply, fill #0
  Filled 2023-06-08: qty 2, 28d supply, fill #1
  Filled 2023-07-07: qty 2, 28d supply, fill #2
  Filled 2023-08-08: qty 2, 28d supply, fill #3
  Filled 2023-09-05: qty 2, 28d supply, fill #4
  Filled 2023-10-05: qty 2, 28d supply, fill #5

## 2022-10-26 ENCOUNTER — Other Ambulatory Visit (HOSPITAL_COMMUNITY): Payer: Self-pay

## 2022-11-10 ENCOUNTER — Other Ambulatory Visit (HOSPITAL_COMMUNITY): Payer: Self-pay

## 2022-12-15 ENCOUNTER — Other Ambulatory Visit (HOSPITAL_COMMUNITY): Payer: Self-pay

## 2022-12-26 ENCOUNTER — Other Ambulatory Visit (HOSPITAL_COMMUNITY): Payer: Self-pay

## 2022-12-27 ENCOUNTER — Other Ambulatory Visit (HOSPITAL_COMMUNITY): Payer: Self-pay

## 2022-12-27 MED ORDER — AMPHETAMINE-DEXTROAMPHETAMINE 20 MG PO TABS
20.0000 mg | ORAL_TABLET | Freq: Three times a day (TID) | ORAL | 0 refills | Status: DC
  Filled 2022-12-27: qty 90, 30d supply, fill #0

## 2022-12-28 ENCOUNTER — Other Ambulatory Visit (HOSPITAL_COMMUNITY): Payer: Self-pay

## 2023-01-01 ENCOUNTER — Other Ambulatory Visit (HOSPITAL_COMMUNITY): Payer: Self-pay

## 2023-01-24 ENCOUNTER — Other Ambulatory Visit (HOSPITAL_COMMUNITY): Payer: Self-pay

## 2023-01-26 ENCOUNTER — Other Ambulatory Visit (HOSPITAL_COMMUNITY): Payer: Self-pay

## 2023-02-25 ENCOUNTER — Other Ambulatory Visit (HOSPITAL_COMMUNITY): Payer: Self-pay

## 2023-02-25 MED ORDER — AMPHETAMINE-DEXTROAMPHETAMINE 20 MG PO TABS
20.0000 mg | ORAL_TABLET | Freq: Three times a day (TID) | ORAL | 0 refills | Status: DC
  Filled 2023-02-25: qty 90, 30d supply, fill #0

## 2023-03-23 ENCOUNTER — Encounter: Payer: Self-pay | Admitting: Cardiovascular Disease

## 2023-04-13 ENCOUNTER — Emergency Department (HOSPITAL_BASED_OUTPATIENT_CLINIC_OR_DEPARTMENT_OTHER)
Admission: EM | Admit: 2023-04-13 | Discharge: 2023-04-13 | Disposition: A | Discharge status: Home / Self Care | Payer: 59 | Attending: Emergency Medicine | Admitting: Emergency Medicine

## 2023-04-13 ENCOUNTER — Emergency Department (HOSPITAL_BASED_OUTPATIENT_CLINIC_OR_DEPARTMENT_OTHER): Payer: 59 | Admitting: Radiology

## 2023-04-13 ENCOUNTER — Encounter (HOSPITAL_BASED_OUTPATIENT_CLINIC_OR_DEPARTMENT_OTHER): Payer: Self-pay | Admitting: Emergency Medicine

## 2023-04-13 ENCOUNTER — Other Ambulatory Visit: Payer: Self-pay

## 2023-04-13 DIAGNOSIS — R072 Precordial pain: Secondary | ICD-10-CM | POA: Insufficient documentation

## 2023-04-13 DIAGNOSIS — N189 Chronic kidney disease, unspecified: Secondary | ICD-10-CM | POA: Diagnosis not present

## 2023-04-13 DIAGNOSIS — I129 Hypertensive chronic kidney disease with stage 1 through stage 4 chronic kidney disease, or unspecified chronic kidney disease: Secondary | ICD-10-CM | POA: Insufficient documentation

## 2023-04-13 DIAGNOSIS — R079 Chest pain, unspecified: Secondary | ICD-10-CM | POA: Diagnosis present

## 2023-04-13 LAB — BASIC METABOLIC PANEL
Anion gap: 9 (ref 5–15)
BUN: 14 mg/dL (ref 8–23)
CO2: 27 mmol/L (ref 22–32)
Calcium: 8.7 mg/dL — ABNORMAL LOW (ref 8.9–10.3)
Chloride: 101 mmol/L (ref 98–111)
Creatinine, Ser: 0.66 mg/dL (ref 0.44–1.00)
GFR, Estimated: >60 mL/min (ref >60)
Glucose, Bld: 99 mg/dL (ref 70–99)
Potassium: 4.8 mmol/L (ref 3.5–5.1)
Sodium: 137 mmol/L (ref 135–145)

## 2023-04-13 LAB — CBC
HCT: 47.8 % — ABNORMAL HIGH (ref 36.0–46.0)
Hemoglobin: 15.8 g/dL — ABNORMAL HIGH (ref 12.0–15.0)
MCH: 31.1 pg (ref 26.0–34.0)
MCHC: 33.1 g/dL (ref 30.0–36.0)
MCV: 94.1 fL (ref 80.0–100.0)
Platelets: 253 10*3/uL (ref 150–400)
RBC: 5.08 MIL/uL (ref 3.87–5.11)
RDW: 13.2 % (ref 11.5–15.5)
WBC: 5.6 10*3/uL (ref 4.0–10.5)
nRBC: 0 % (ref 0.0–0.2)

## 2023-04-13 LAB — HEPATIC FUNCTION PANEL
ALT: 37 U/L (ref 0–44)
AST: 36 U/L (ref 15–41)
Albumin: 4.5 g/dL (ref 3.5–5.0)
Alkaline Phosphatase: 88 U/L (ref 38–126)
Bilirubin, Direct: 0.1 mg/dL (ref 0.0–0.2)
Indirect Bilirubin: 0.2 mg/dL — ABNORMAL LOW (ref 0.3–0.9)
Total Bilirubin: 0.3 mg/dL (ref 0.3–1.2)
Total Protein: 7.3 g/dL (ref 6.5–8.1)

## 2023-04-13 LAB — LIPASE, BLOOD: Lipase: 14 U/L (ref 11–51)

## 2023-04-13 LAB — TROPONIN I (HIGH SENSITIVITY)
Troponin I (High Sensitivity): 3 ng/L (ref <18)
Troponin I (High Sensitivity): 3 ng/L (ref <18)

## 2023-04-13 MED ORDER — ONDANSETRON HCL 4 MG/2ML IJ SOLN
4.0000 mg | Freq: Once | INTRAMUSCULAR | Status: AC
  Administered 2023-04-13: 4 mg via INTRAVENOUS
  Filled 2023-04-13: qty 2

## 2023-04-13 MED ORDER — ASPIRIN 81 MG PO CHEW
324.0000 mg | CHEWABLE_TABLET | Freq: Once | ORAL | Status: AC
  Administered 2023-04-13: 324 mg via ORAL
  Filled 2023-04-13: qty 4

## 2023-04-13 MED ORDER — NITROGLYCERIN 0.4 MG SL SUBL
0.4000 mg | SUBLINGUAL_TABLET | SUBLINGUAL | Status: DC | PRN
  Administered 2023-04-13 (×3): 0.4 mg via SUBLINGUAL
  Filled 2023-04-13: qty 1

## 2023-04-13 MED ORDER — SODIUM CHLORIDE 0.9 % IV BOLUS
500.0000 mL | Freq: Once | INTRAVENOUS | Status: AC
  Administered 2023-04-13: 500 mL via INTRAVENOUS

## 2023-04-13 NOTE — Discharge Instructions (Signed)

## 2023-04-13 NOTE — ED Notes (Signed)
Patient provided with crackers per EDP.

## 2023-04-13 NOTE — ED Triage Notes (Signed)
Pt here from Md office with c/o chest pain and cough along with a fever this past saturday with some n/v and sob

## 2023-04-13 NOTE — ED Notes (Signed)
Reviewed AVS/discharge instruction with patient. Time allotted for and all questions answered. Patient is agreeable for d/c and escorted to ed exit by staff.  

## 2023-04-13 NOTE — ED Provider Notes (Signed)
Emergency Department Provider Note   I have reviewed the triage vital signs and the nursing notes.   HISTORY  Chief Complaint Chest Pain   HPI Angela Good is a 62 y.o. female past history of hyperlipidemia, hypertension, [place] Charters family history of ACS, and 94th percentile calcium score on CTA from June 2023 presents to the emergency department with worsening chest, back, neck pain at rest.  She is noticed occasional pain over the past couple of months, often worse in the morning but in the past week her symptoms have improved.  She is having pressure like discomfort in her chest with some occasional nausea.  No diaphoresis.  She had worsening symptoms today which radiated to her neck.  She notes that she also has fibromyalgia and musculoskeletal type discomfort but this feels different.  No injury.  She works as a Engineer, civil (consulting) and has not noticed any exertional type pain.  She notes that her mother and other family members have had fatal heart attacks in their late 40s/50s. Follows with Dr. Royann Shivers.   Past Medical History:  Diagnosis Date   Anxiety    Appendicitis    Arthritis    left sided   Burning tongue syndrome 03/15/2016   Chronic kidney disease    hx kidney stones 2010, had stent put in   Common migraine without intractability 03/15/2016   Fibromyalgia    Rectocele     Review of Systems  Constitutional: No fever/chills Cardiovascular: Positive chest pain. Respiratory: Denies shortness of breath. Gastrointestinal: No abdominal pain. Positive nausea/vomiting this past weekend.  Musculoskeletal: Negative for back pain. Skin: Negative for rash. Neurological: Negative for headaches, focal weakness or numbness.  ____________________________________________   PHYSICAL EXAM:  VITAL SIGNS: ED Triage Vitals  Enc Vitals Group     BP 04/13/23 1603 (!) 141/116     Pulse Rate 04/13/23 1603 85     Resp 04/13/23 1603 18     Temp 04/13/23 1603 98.2 F (36.8 C)     Temp src --       SpO2 04/13/23 1603 99 %   Constitutional: Alert and oriented. Well appearing and in no acute distress. Eyes: Conjunctivae are normal. Head: Atraumatic. Nose: No congestion/rhinnorhea. Mouth/Throat: Mucous membranes are moist.   Neck: No stridor.  Cardiovascular: Normal rate, regular rhythm. Good peripheral circulation. Grossly normal heart sounds.   Respiratory: Normal respiratory effort.  No retractions. Lungs CTAB. Gastrointestinal: Soft and nontender. No distention.  Musculoskeletal: No gross deformities of extremities. Neurologic:  Normal speech and language.  Skin:  Skin is warm, dry and intact. No rash noted.   ____________________________________________   LABS (all labs ordered are listed, but only abnormal results are displayed)  Labs Reviewed  BASIC METABOLIC PANEL - Abnormal; Notable for the following components:      Result Value   Calcium 8.7 (*)    All other components within normal limits  CBC - Abnormal; Notable for the following components:   Hemoglobin 15.8 (*)    HCT 47.8 (*)    All other components within normal limits  HEPATIC FUNCTION PANEL - Abnormal; Notable for the following components:   Indirect Bilirubin 0.2 (*)    All other components within normal limits  LIPASE, BLOOD  TROPONIN I (HIGH SENSITIVITY)  TROPONIN I (HIGH SENSITIVITY)   ____________________________________________  EKG   EKG Interpretation  Date/Time:  Wednesday April 13 2023 16:06:51 EDT Ventricular Rate:  88 PR Interval:  108 QRS Duration: 68 QT Interval:  360 QTC Calculation:  435 R Axis:   12 Text Interpretation: Sinus rhythm with short PR Otherwise normal ECG No previous ECGs available Confirmed by Alona Bene [phone]) on 04/13/2023 4:09:09 PM        ____________________________________________  RADIOLOGY  DG Chest 2 View  Result Date: 04/13/2023 CLINICAL DATA:  Chest pain and cough with fever. EXAM: CHEST - 2 VIEW COMPARISON:  April 13, 2023 FINDINGS: The  heart size and mediastinal contours are within normal limits. Mild, diffuse, chronic appearing increased lung markings are noted. There is no evidence of acute infiltrate, pleural effusion or pneumothorax. The visualized skeletal structures are unremarkable. IMPRESSION: No active cardiopulmonary disease. Electronically Signed   By: Aram Candela M.D.   On: 04/13/2023 17:16    ____________________________________________   PROCEDURES  Procedure(s) performed:   Procedures  None  ____________________________________________   INITIAL IMPRESSION / ASSESSMENT AND PLAN / ED COURSE  Pertinent labs & imaging results that were available during my care of the patient were reviewed by me and considered in my medical decision making (see chart for details).   This patient is Presenting for Evaluation of CP, which does require a range of treatment options, and is a complaint that involves a high risk of morbidity and mortality.  The Differential Diagnoses includes but is not exclusive to acute coronary syndrome, aortic dissection, pulmonary embolism, cardiac tamponade, community-acquired pneumonia, pericarditis, musculoskeletal chest wall pain, etc.   Critical Interventions-    Medications  nitroGLYCERIN (NITROSTAT) SL tablet 0.4 mg (0.4 mg Sublingual Given 04/13/23 1755)  aspirin chewable tablet 324 mg (324 mg Oral Given 04/13/23 1735)  sodium chloride 0.9 % bolus 500 mL (0 mLs Intravenous Stopped 04/13/23 1920)  ondansetron (ZOFRAN) injection 4 mg (4 mg Intravenous Given 04/13/23 1739)    Reassessment after intervention: symptoms improved.    I did obtain Additional Historical Information from husband at bedside.  I decided to review pertinent External Data, and in summary CTA coronary from June 2023 with high Calcium score (94th percentile).   Clinical Laboratory Tests Ordered, included troponin negative x 2.  No acute kidney injury.  No anemia.  No leukocytosis.  Radiologic Tests  Ordered, included CXR. I independently interpreted the images and agree with radiology interpretation.   Cardiac Monitor Tracing which shows NSR.    Social Determinants of Health Risk patient is a non-smoker.   Consult complete with Dr. Sw[place] with Cardiology. Discussed case and reviewed chart/CTA. Percentile high for age but if negative troponin x 2 would recommend expedited outpatient follow up in their office over admit.   Medical Decision Making: Summary:  Patient presents to the emergency department for evaluation of chest pain at rest worsening over the past week but present mainly for 2 months.  She has a strong family history of ACS and known coronary calcification on CTA from 1 year prior.  Given worsening symptoms and some concerning features do plan to reach out to cardiology and consider admission vs close outpatient follow up.  No acute ischemic pattern on EKG or elevated troponins at this time.   Reevaluation with update and discussion with patient and husband. Negative troponin x 2. Had a shared decision making discussion regarding obs admit vs close outpatient follow up per plan with Dr. Sw[place]and. They will d/c home with strict Ed return. Will place a referral to Cardiology to try and expedite follow up. Patient and husband comfortable with the plan at discharge.   Considered admission but per discussion above, plan for close outpatient follow up with  Cardiology.   Patient's presentation is most consistent with acute presentation with potential threat to life or bodily function.   Disposition: discharge  ____________________________________________  FINAL CLINICAL IMPRESSION(S) / ED DIAGNOSES  Final diagnoses:  Precordial chest pain    Note:  This document was prepared using Dragon voice recognition software and may include unintentional dictation errors.  Alona Bene, MD, [facility]    Nayleen Janosik, Arlyss Repress, MD 04/13/23 [phone]

## 2023-04-22 ENCOUNTER — Ambulatory Visit: Payer: 59 | Attending: Cardiovascular Disease | Admitting: Cardiovascular Disease

## 2023-04-22 ENCOUNTER — Encounter: Payer: Self-pay | Admitting: Cardiovascular Disease

## 2023-04-22 VITALS — BP 120/92 | HR 75 | Ht 62.5 in | Wt 139.0 lb

## 2023-04-22 DIAGNOSIS — E782 Mixed hyperlipidemia: Secondary | ICD-10-CM

## 2023-04-22 DIAGNOSIS — R931 Abnormal findings on diagnostic imaging of heart and coronary circulation: Secondary | ICD-10-CM | POA: Diagnosis not present

## 2023-04-22 LAB — LIPID PANEL
Chol/HDL Ratio: 2.9 ratio (ref 0.0–4.4)
Cholesterol, Total: 192 mg/dL (ref 100–199)
HDL: 66 mg/dL (ref >39)
LDL Chol Calc (NIH): 87 mg/dL (ref 0–99)
Triglycerides: 236 mg/dL — ABNORMAL HIGH (ref 0–149)
VLDL Cholesterol Cal: 39 mg/dL (ref 5–40)

## 2023-04-22 NOTE — Patient Instructions (Signed)
Medication Instructions:  No changes *If you need a refill on your cardiac medications before your next appointment, please call your pharmacy*   Lab Work: Lipid panel If you have labs (blood work) drawn today and your tests are completely normal, you will receive your results only by: MyChart Message (if you have MyChart) OR A paper copy in the mail If you have any lab test that is abnormal or we need to change your treatment, we will call you to review the results.  Follow-Up: At [facility], you and your health needs are our priority.  As part of our continuing mission to provide you with exceptional heart care, we have created designated Provider Care Teams.  These Care Teams include your primary Cardiologist (physician) and Advanced Practice Providers (APPs -  Physician Assistants and Nurse Practitioners) who all work together to provide you with the care you need, when you need it.  We recommend signing up for the patient portal called "MyChart".  Sign up information is provided on this After Visit Summary.  MyChart is used to connect with patients for Virtual Visits (Telemedicine).  Patients are able to view lab/test results, encounter notes, upcoming appointments, etc.  Non-urgent messages can be sent to your provider as well.   To learn more about what you can do with MyChart, go to [email or web address].    Your next appointment:   1 year(s)  Provider:   Thurmon Fair, MD
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
## 2023-04-27 ENCOUNTER — Other Ambulatory Visit (HOSPITAL_COMMUNITY): Payer: Self-pay

## 2023-04-27 ENCOUNTER — Other Ambulatory Visit (HOSPITAL_BASED_OUTPATIENT_CLINIC_OR_DEPARTMENT_OTHER): Payer: Self-pay

## 2023-04-27 MED ORDER — ALPRAZOLAM 1 MG PO TABS
1.0000 mg | ORAL_TABLET | Freq: Two times a day (BID) | ORAL | 2 refills | Status: DC | PRN
  Filled 2023-04-27: qty 60, 30d supply, fill #0
  Filled 2023-06-08: qty 60, 30d supply, fill #1
  Filled 2023-08-19: qty 60, 30d supply, fill #2

## 2023-04-27 MED ORDER — AMPHETAMINE-DEXTROAMPHETAMINE 20 MG PO TABS
20.0000 mg | ORAL_TABLET | Freq: Three times a day (TID) | ORAL | 0 refills | Status: DC
  Filled 2023-04-27: qty 90, 30d supply, fill #0

## 2023-04-28 ENCOUNTER — Other Ambulatory Visit (HOSPITAL_BASED_OUTPATIENT_CLINIC_OR_DEPARTMENT_OTHER): Payer: Self-pay

## 2023-06-08 ENCOUNTER — Other Ambulatory Visit (HOSPITAL_BASED_OUTPATIENT_CLINIC_OR_DEPARTMENT_OTHER): Payer: Self-pay

## 2023-07-07 ENCOUNTER — Other Ambulatory Visit (HOSPITAL_BASED_OUTPATIENT_CLINIC_OR_DEPARTMENT_OTHER): Payer: Self-pay

## 2023-07-16 ENCOUNTER — Other Ambulatory Visit (HOSPITAL_BASED_OUTPATIENT_CLINIC_OR_DEPARTMENT_OTHER): Payer: Self-pay

## 2023-07-25 ENCOUNTER — Telehealth: Payer: Self-pay | Admitting: *Deleted

## 2023-07-25 NOTE — Telephone Encounter (Signed)
Pre-operative Risk Assessment    Patient Name: Angela Good  DOB: 1961-04-10 MRN: [id number]      Request for Surgical Clearance    Procedure:   LAPAROSCOPIC LEFT OVARIAN CYSTECTOMY  Date of Surgery:  Clearance 10/13/23                                 Surgeon:  Marcelle Overlie, MD Surgeon's Group or Practice Name:  [facility] Phone number:  [phone] Fax number:  [phone]   Type of Clearance Requested:   - Medical    Type of Anesthesia:  Local    Additional requests/questions:    [name]   07/25/2023, 3:04 PM

## 2023-07-25 NOTE — Telephone Encounter (Signed)
Name: Angela Good  DOB: 03/02/1961  MRN: [id number]  Primary Cardiologist: Thurmon Fair, MD   Preoperative team, please contact this patient and set up a phone call appointment for further preoperative risk assessment. Please obtain consent and complete medication review. Thank you for your help.  I confirm that guidance regarding antiplatelet and oral anticoagulation therapy has been completed and, if necessary, noted below.  None requested.    Carlos Levering, NP 07/25/2023, 4:32 PM [facility]

## 2023-07-26 NOTE — Telephone Encounter (Signed)
I left a message for the patient to call our office back to schedule a tele visit for pre-op clearance.  

## 2023-07-28 NOTE — Telephone Encounter (Signed)
Lvm to call back to set up appointment

## 2023-08-01 ENCOUNTER — Telehealth: Payer: Self-pay | Admitting: *Deleted

## 2023-08-01 NOTE — Telephone Encounter (Signed)
Pt has been scheduled for tele pre op appt 09/21/23 @ 10:20. Med rec and consent are done.     Patient Consent for Virtual Visit        Angela Good has provided verbal consent on 08/01/2023 for a virtual visit (video or telephone).   CONSENT FOR VIRTUAL VISIT FOR:  Angela Good  By participating in this virtual visit I agree to the following:  I hereby voluntarily request, consent and authorize [facility] and its employed or contracted physicians, physician assistants, nurse practitioners or other licensed health care professionals (the Practitioner), to provide me with telemedicine health care services (the "Services") as deemed necessary by the treating Practitioner. I acknowledge and consent to receive the Services by the Practitioner via telemedicine. I understand that the telemedicine visit will involve communicating with the Practitioner through live audiovisual communication technology and the disclosure of certain medical information by electronic transmission. I acknowledge that I have been given the opportunity to request an in-person assessment or other available alternative prior to the telemedicine visit and am voluntarily participating in the telemedicine visit.  I understand that I have the right to withhold or withdraw my consent to the use of telemedicine in the course of my care at any time, without affecting my right to future care or treatment, and that the Practitioner or I may terminate the telemedicine visit at any time. I understand that I have the right to inspect all information obtained and/or recorded in the course of the telemedicine visit and may receive copies of available information for a reasonable fee.  I understand that some of the potential risks of receiving the Services via telemedicine include:  Delay or interruption in medical evaluation due to technological equipment failure or disruption; Information transmitted may not be sufficient  (e.g. poor resolution of images) to allow for appropriate medical decision making by the Practitioner; and/or  In rare instances, security protocols could fail, causing a breach of personal health information.  Furthermore, I acknowledge that it is my responsibility to provide information about my medical history, conditions and care that is complete and accurate to the best of my ability. I acknowledge that Practitioner's advice, recommendations, and/or decision may be based on factors not within their control, such as incomplete or inaccurate data provided by me or distortions of diagnostic images or specimens that may result from electronic transmissions. I understand that the practice of medicine is not an exact science and that Practitioner makes no warranties or guarantees regarding treatment outcomes. I acknowledge that a copy of this consent can be made available to me via my patient portal [facility] MyChart), or I can request a printed copy by calling the office of [facility].    I understand that my insurance will be billed for this visit.   I have read or had this consent read to me. I understand the contents of this consent, which adequately explains the benefits and risks of the Services being provided via telemedicine.  I have been provided ample opportunity to ask questions regarding this consent and the Services and have had my questions answered to my satisfaction. I give my informed consent for the services to be provided through the use of telemedicine in my medical care

## 2023-08-01 NOTE — Telephone Encounter (Signed)
Pt has been scheduled for tele pre op appt 09/21/23 @ 10:20. Med rec and consent are done.

## 2023-08-08 ENCOUNTER — Other Ambulatory Visit (HOSPITAL_BASED_OUTPATIENT_CLINIC_OR_DEPARTMENT_OTHER): Payer: Self-pay

## 2023-08-08 ENCOUNTER — Other Ambulatory Visit: Payer: Self-pay | Admitting: Family Medicine

## 2023-08-08 DIAGNOSIS — Z1231 Encounter for screening mammogram for malignant neoplasm of breast: Secondary | ICD-10-CM

## 2023-08-19 ENCOUNTER — Other Ambulatory Visit (HOSPITAL_BASED_OUTPATIENT_CLINIC_OR_DEPARTMENT_OTHER): Payer: Self-pay

## 2023-08-19 ENCOUNTER — Other Ambulatory Visit (HOSPITAL_COMMUNITY): Payer: Self-pay

## 2023-08-19 ENCOUNTER — Other Ambulatory Visit: Payer: Self-pay

## 2023-08-19 MED ORDER — AMPHETAMINE-DEXTROAMPHETAMINE 20 MG PO TABS
20.0000 mg | ORAL_TABLET | Freq: Three times a day (TID) | ORAL | 0 refills | Status: DC
  Filled 2023-08-19: qty 90, 30d supply, fill #0

## 2023-09-05 ENCOUNTER — Other Ambulatory Visit (HOSPITAL_BASED_OUTPATIENT_CLINIC_OR_DEPARTMENT_OTHER): Payer: Self-pay

## 2023-09-07 ENCOUNTER — Ambulatory Visit: Payer: 59

## 2023-09-07 ENCOUNTER — Ambulatory Visit
Admission: RE | Admit: 2023-09-07 | Discharge: 2023-09-07 | Disposition: A | Discharge status: Home / Self Care | Payer: 59 | Source: Ambulatory Visit | Attending: Family Medicine | Admitting: Family Medicine

## 2023-09-07 DIAGNOSIS — Z1231 Encounter for screening mammogram for malignant neoplasm of breast: Secondary | ICD-10-CM

## 2023-09-12 ENCOUNTER — Other Ambulatory Visit (HOSPITAL_BASED_OUTPATIENT_CLINIC_OR_DEPARTMENT_OTHER): Payer: Self-pay

## 2023-09-20 NOTE — Progress Notes (Unsigned)
Virtual Visit via Telephone Note   Because of Angela Good's co-morbid illnesses, she is at least at moderate risk for complications without adequate follow up.  This format is felt to be most appropriate for this patient at this time.  The patient did not have access to video technology/had technical difficulties with video requiring transitioning to audio format only (telephone).  All issues noted in this document were discussed and addressed.  No physical exam could be performed with this format.  Please refer to the patient's chart for her consent to telehealth for [facility].  Evaluation Performed:  Preoperative cardiovascular risk assessment _____________   Date:  09/21/2023   Patient ID:  Angela Good, DOB 11/12/1960, MRN [id number] Patient Location:  Home Provider location:   Office  Primary Care Provider:  Merri Brunette, MD Primary Cardiologist:  Thurmon Fair, MD  Chief Complaint / Patient Profile   62 y.o. y/o female with a h/o migraines, fibromylagia, treated hypothyroidism, hypercholesterolemia, elevated coronary calcium score who is pending laparoscopic left ovarian cystectomy with Dr. Marcelle Overlie at [facility] and presents today for telephonic preoperative cardiovascular risk assessment.  History of Present Illness    Angela Good is a 62 y.o. female who presents via audio/video conferencing for a telehealth visit today.  Pt was last seen in cardiology clinic on 04/22/23 by Dr. Royann Shivers.  At that time Angela Good was doing well.  The patient is now pending procedure as outlined above. Since her last visit, she has remained stable from a cardiac perspective. She notes that she did have one episode of chest discomfort that was musculoskeletal in nature a few weeks ago, no recurrence. She is able to exceed 4 METs of activity without anginal symptoms. She denies palpitations, dyspnea, pnd, orthopnea, n, v, dizziness, syncope, edema,  weight gain, or early satiety.   Past Medical History    Past Medical History:  Diagnosis Date   Anxiety    Appendicitis    Arthritis    left sided   Burning tongue syndrome 03/15/2016   Chronic kidney disease    hx kidney stones 2010, had stent put in   Common migraine without intractability 03/15/2016   Fibromyalgia    Rectocele    Past Surgical History:  Procedure Laterality Date   APPENDECTOMY     LITHOTRIPSY      Allergies  Allergies  Allergen Reactions   Augmentin [Amoxicillin-Pot Clavulanate] Nausea And Vomiting   Effexor [Venlafaxine]     Heart palpatations   Erythromycin     Bothers stomach    Latex     Cuts in hands   Lexapro [Escitalopram Oxalate]     Heart palpitations   Home Medications    Prior to Admission medications   Medication Sig Start Date End Date Taking? Authorizing Provider  ALPRAZolam Prudy Feeler) 1 MG tablet Take 1 mg by mouth at bedtime as needed for anxiety.    [provider]  ALPRAZolam Prudy Feeler) 1 MG tablet Take 1 tablet (1 mg total) by mouth 2 (two) times daily as needed. 04/27/23     amphetamine-dextroamphetamine (ADDERALL) 20 MG tablet Take 20 mg by mouth in the morning and at bedtime.    [provider]  amphetamine-dextroamphetamine (ADDERALL) 20 MG tablet Take 1 tablet (20 mg total) by mouth 3 (three) times daily. 08/19/23     celecoxib (CELEBREX) 200 MG capsule Take 200 mg by mouth daily as needed. Patient not taking: Reported on 04/22/2023  [provider]  cyclobenzaprine (FLEXERIL) 10 MG tablet Take 10 mg by mouth 3 (three) times daily as needed. Patient not taking: Reported on 04/22/2023 06/09/17   [provider]  doxycycline (VIBRA-TABS) 100 MG tablet Take 100 mg by mouth 2 (two) times daily.    [provider]  escitalopram (LEXAPRO) 20 MG tablet Take 20 mg by mouth daily.    [provider]  esomeprazole (NEXIUM) 40 MG capsule Take 1 capsule (40 mg total) by mouth 2 (two) times  daily. 09/11/13   Hilarie Fredrickson, MD  Evolocumab (REPATHA SURECLICK) 140 MG/ML SOAJ Inject 140 mg into the skin every 14 (fourteen) days. 10/15/22   Croitoru, Mihai, MD  fluticasone Aleda Grana) 50 MCG/ACT nasal spray  08/05/15   [provider]  levothyroxine (SYNTHROID) 88 MCG tablet Take 88 mcg by mouth daily. 03/22/22   [provider]  methocarbamol (ROBAXIN) 500 MG tablet Take 500 mg by mouth 3 (three) times daily as needed.    [provider]  Prasterone (INTRAROSA) 6.5 MG INST Intrarosa 6.5 mg vaginal insert Patient not taking: Reported on 04/22/2023    [provider]  Probiotic Product (ALIGN PO) Take 1 tablet by mouth daily in the afternoon. Patient not taking: Reported on 04/22/2023    [provider]  SUMAtriptan (IMITREX) 100 MG tablet Take 100 mg by mouth every 2 (two) hours as needed for migraine. Patient not taking: Reported on 04/22/2023    [provider]    Physical Exam    Vital Signs:  Angela Good does not have vital signs available for review today.  Given telephonic nature of communication, physical exam is limited. AAOx3. NAD. Normal affect.  Speech and respirations are unlabored.  Accessory Clinical Findings    None  Assessment & Plan    1.  Preoperative Cardiovascular Risk Assessment: Laparoscopic left ovarian cystectomy.  Angela Good's perioperative risk of a major cardiac event is 0.4% according to the Revised Cardiac Risk Index (RCRI). Therefore, she is at low risk for perioperative complications.   Her functional capacity is excellent at 9.89 METs according to the Duke Activity Status Index (DASI). Recommendations: According to ACC/AHA guidelines, no further cardiovascular testing needed.  The patient may proceed to surgery at acceptable risk.     The patient was advised that if she develops new symptoms prior to surgery to contact our office to arrange for a follow-up visit, and she verbalized  understanding.  A copy of this note will be routed to requesting surgeon.  Time:   Today, I have spent 10 minutes with the patient with telehealth technology discussing medical history, symptoms, and management plan.    Angela Harbour, NP  09/21/2023, 10:34 AM

## 2023-09-21 ENCOUNTER — Ambulatory Visit: Payer: 59 | Attending: Cardiovascular Disease | Admitting: Cardiology

## 2023-09-21 DIAGNOSIS — Z01810 Encounter for preprocedural cardiovascular examination: Secondary | ICD-10-CM | POA: Diagnosis not present

## 2023-10-05 ENCOUNTER — Other Ambulatory Visit (HOSPITAL_BASED_OUTPATIENT_CLINIC_OR_DEPARTMENT_OTHER): Payer: Self-pay

## 2023-10-05 MED ORDER — ALPRAZOLAM 1 MG PO TABS
1.0000 mg | ORAL_TABLET | Freq: Two times a day (BID) | ORAL | 2 refills | Status: DC | PRN
  Filled 2023-10-05: qty 60, 30d supply, fill #0
  Filled 2024-01-13 (×2): qty 60, 30d supply, fill #1
  Filled 2024-03-12: qty 60, 30d supply, fill #2

## 2023-10-05 MED ORDER — AMPHETAMINE-DEXTROAMPHETAMINE 20 MG PO TABS
20.0000 mg | ORAL_TABLET | Freq: Three times a day (TID) | ORAL | 0 refills | Status: DC
  Filled 2023-10-05: qty 90, 30d supply, fill #0

## 2023-10-06 ENCOUNTER — Encounter (HOSPITAL_BASED_OUTPATIENT_CLINIC_OR_DEPARTMENT_OTHER): Payer: Self-pay | Admitting: Obstetrics and Gynecology

## 2023-10-11 NOTE — Progress Notes (Signed)
I spoke with patient to do pre-op phone call for surgery scheduled on 10/13/23. Patient sounded very hoarse. She stated that she had laryngitis, and felt like her throat was itchy and there was mucus in the back of her throat. She also stated that she was more tired than usual. I reviewed this case with Dr. Richardson Landry, MDA. Per Dr. Richardson Landry, patient needs to be rescheduled when she has been symptom free for 2 weeks. I called Corrie Dandy, the scheduler for Dr. Vincente Poli and let her know the above information. Corrie Dandy stated that she would reschedule the patient.

## 2023-10-13 ENCOUNTER — Ambulatory Visit (HOSPITAL_BASED_OUTPATIENT_CLINIC_OR_DEPARTMENT_OTHER): Admission: RE | Admit: 2023-10-13 | Payer: 59 | Source: Home / Self Care | Admitting: Obstetrics and Gynecology

## 2023-10-13 DIAGNOSIS — N83202 Unspecified ovarian cyst, left side: Secondary | ICD-10-CM

## 2023-10-13 HISTORY — DX: Personal history of urinary calculi: Z87.442

## 2023-10-13 HISTORY — DX: Hypothyroidism, unspecified: E03.9

## 2023-10-13 HISTORY — DX: Mixed hyperlipidemia: E78.2

## 2023-10-13 SURGERY — LAPAROSCOPIC OVARIAN CYSTECTOMY
Anesthesia: General

## 2023-11-16 ENCOUNTER — Other Ambulatory Visit: Payer: Self-pay | Admitting: Cardiovascular Disease

## 2023-11-16 ENCOUNTER — Other Ambulatory Visit (HOSPITAL_BASED_OUTPATIENT_CLINIC_OR_DEPARTMENT_OTHER): Payer: Self-pay

## 2023-11-16 DIAGNOSIS — R931 Abnormal findings on diagnostic imaging of heart and coronary circulation: Secondary | ICD-10-CM

## 2023-11-16 DIAGNOSIS — E782 Mixed hyperlipidemia: Secondary | ICD-10-CM

## 2023-11-16 MED ORDER — REPATHA SURECLICK 140 MG/ML ~~LOC~~ SOAJ
140.0000 mg | SUBCUTANEOUS | 3 refills | Status: DC
  Filled 2023-11-16: qty 2, 28d supply, fill #0
  Filled 2023-12-09: qty 2, 28d supply, fill #1
  Filled 2024-01-06: qty 2, 28d supply, fill #2
  Filled 2024-02-01: qty 2, 28d supply, fill #3
  Filled 2024-03-01: qty 2, 28d supply, fill #4
  Filled 2024-04-02: qty 2, 28d supply, fill #5
  Filled 2024-04-26: qty 2, 28d supply, fill #6
  Filled 2024-05-07 – 2024-05-17 (×2): qty 2, 28d supply, fill #7
  Filled 2024-06-20: qty 2, 28d supply, fill #8
  Filled 2024-07-17: qty 2, 28d supply, fill #9
  Filled 2024-08-07: qty 2, 28d supply, fill #10
  Filled 2024-09-17: qty 2, 28d supply, fill #11

## 2023-12-09 ENCOUNTER — Other Ambulatory Visit (HOSPITAL_BASED_OUTPATIENT_CLINIC_OR_DEPARTMENT_OTHER): Payer: Self-pay

## 2024-01-06 ENCOUNTER — Other Ambulatory Visit (HOSPITAL_BASED_OUTPATIENT_CLINIC_OR_DEPARTMENT_OTHER): Payer: Self-pay

## 2024-01-13 ENCOUNTER — Other Ambulatory Visit (HOSPITAL_BASED_OUTPATIENT_CLINIC_OR_DEPARTMENT_OTHER): Payer: Self-pay

## 2024-01-17 NOTE — Pre-Procedure Instructions (Signed)
 Surgical Instructions   Your procedure is scheduled on January 23, 2024. Report to [facility] Main Entrance "A" at 5:30 A.M., then check in with the Admitting office. Any questions or running late day of surgery: call [phone]  Questions prior to your surgery date: call [phone], Monday-Friday, 8am-4pm. If you experience any cold or flu symptoms such as cough, fever, chills, shortness of breath, etc. between now and your scheduled surgery, please notify us at the above number.     Remember:  Do not eat or drink after midnight the night before your surgery   Take these medicines the morning of surgery with A SIP OF WATER: doxycycline (VIBRA-TABS)  escitalopram (LEXAPRO)  esomeprazole (NEXIUM)  levothyroxine (SYNTHROID)     May take these medicines IF NEEDED: ALPRAZolam (XANAX)  fluticasone (FLONASE) nasal spray  methocarbamol (ROBAXIN)    One week prior to surgery, STOP taking any Aspirin (unless otherwise instructed by your surgeon) Aleve, Naproxen, Ibuprofen, Motrin, Advil, Goody's, BC's, all herbal medications, fish oil, and non-prescription vitamins.                     Do NOT Smoke (Tobacco/Vaping) for 24 hours prior to your procedure.  If you use a CPAP at night, you may bring your mask/headgear for your overnight stay.   You will be asked to remove any contacts, glasses, piercing's, hearing aid's, dentures/partials prior to surgery. Please bring cases for these items if needed.    Patients discharged the day of surgery will not be allowed to drive home, and someone needs to stay with them for 24 hours.  SURGICAL WAITING ROOM VISITATION Patients may have no more than 2 support people in the waiting area - these visitors may rotate.   Pre-op nurse will coordinate an appropriate time for 1 ADULT support person, who may not rotate, to accompany patient in pre-op.  Children under the age of 14 must have an adult with them who is not the patient and must remain in the  main waiting area with an adult.  If the patient needs to stay at the hospital during part of their recovery, the visitor guidelines for inpatient rooms apply.  Please refer to the [facility] website for the visitor guidelines for any additional information.   If you received a COVID test during your pre-op visit  it is requested that you wear a mask when out in public, stay away from anyone that may not be feeling well and notify your surgeon if you develop symptoms. If you have been in contact with anyone that has tested positive in the last 10 days please notify you surgeon.      Pre-operative CHG Bathing Instructions   You can play a key role in reducing the risk of infection after surgery. Your skin needs to be as free of germs as possible. You can reduce the number of germs on your skin by washing with CHG (chlorhexidine gluconate) soap before surgery. CHG is an antiseptic soap that kills germs and continues to kill germs even after washing.   DO NOT use if you have an allergy to chlorhexidine/CHG or antibacterial soaps. If your skin becomes reddened or irritated, stop using the CHG and notify one of our RNs at [phone].              TAKE A SHOWER THE NIGHT BEFORE SURGERY AND THE DAY OF SURGERY    Please keep in mind the following:  DO NOT shave, including legs and  underarms, 48 hours prior to surgery.   You may shave your face before/day of surgery.  Place clean sheets on your bed the night before surgery Use a clean washcloth (not used since being washed) for each shower. DO NOT sleep with pet's night before surgery.  CHG Shower Instructions:  Wash your face and private area with normal soap. If you choose to wash your hair, wash first with your normal shampoo.  After you use shampoo/soap, rinse your hair and body thoroughly to remove shampoo/soap residue.  Turn the water OFF and apply half the bottle of CHG soap to a CLEAN washcloth.  Apply CHG soap ONLY FROM YOUR NECK  DOWN TO YOUR TOES (washing for 3-5 minutes)  DO NOT use CHG soap on face, private areas, open wounds, or sores.  Pay special attention to the area where your surgery is being performed.  If you are having back surgery, having someone wash your back for you may be helpful. Wait 2 minutes after CHG soap is applied, then you may rinse off the CHG soap.  Pat dry with a clean towel  Put on clean pajamas    Additional instructions for the day of surgery: DO NOT APPLY any lotions, deodorants, cologne, or perfumes.   Do not wear jewelry or makeup Do not wear nail polish, gel polish, artificial nails, or any other type of covering on natural nails (fingers and toes) Do not bring valuables to the hospital. [facility] is not responsible for valuables/personal belongings. Put on clean/comfortable clothes.  Please brush your teeth.  Ask your nurse before applying any prescription medications to the skin.

## 2024-01-18 ENCOUNTER — Inpatient Hospital Stay (HOSPITAL_COMMUNITY)
Admission: RE | Admit: 2024-01-18 | Discharge: 2024-01-18 | Disposition: A | Discharge status: Home / Self Care | Source: Ambulatory Visit

## 2024-01-20 ENCOUNTER — Encounter (HOSPITAL_COMMUNITY): Payer: Self-pay

## 2024-01-20 ENCOUNTER — Other Ambulatory Visit: Payer: Self-pay

## 2024-01-20 ENCOUNTER — Encounter (HOSPITAL_COMMUNITY)
Admission: RE | Admit: 2024-01-20 | Discharge: 2024-01-20 | Disposition: A | Discharge status: Home / Self Care | Source: Ambulatory Visit | Attending: Obstetrics and Gynecology | Admitting: Obstetrics and Gynecology

## 2024-01-20 VITALS — BP 124/93 | HR 100 | Temp 98.7°F | Resp 18 | Ht 62.0 in | Wt 137.3 lb

## 2024-01-20 DIAGNOSIS — Z01812 Encounter for preprocedural laboratory examination: Secondary | ICD-10-CM | POA: Insufficient documentation

## 2024-01-20 DIAGNOSIS — N83202 Unspecified ovarian cyst, left side: Secondary | ICD-10-CM | POA: Diagnosis not present

## 2024-01-20 DIAGNOSIS — I2510 Atherosclerotic heart disease of native coronary artery without angina pectoris: Secondary | ICD-10-CM | POA: Diagnosis not present

## 2024-01-20 DIAGNOSIS — Z01818 Encounter for other preprocedural examination: Secondary | ICD-10-CM | POA: Diagnosis present

## 2024-01-20 HISTORY — DX: Gastro-esophageal reflux disease without esophagitis: K21.9

## 2024-01-20 HISTORY — DX: Attention-deficit hyperactivity disorder, unspecified type: F90.9

## 2024-01-20 HISTORY — DX: Depression, unspecified: F32.A

## 2024-01-20 HISTORY — DX: Family history of other specified conditions: Z84.89

## 2024-01-20 LAB — BASIC METABOLIC PANEL
Anion gap: 8 (ref 5–15)
BUN: 14 mg/dL (ref 8–23)
CO2: 26 mmol/L (ref 22–32)
Calcium: 8.8 mg/dL — ABNORMAL LOW (ref 8.9–10.3)
Chloride: 105 mmol/L (ref 98–111)
Creatinine, Ser: 0.7 mg/dL (ref 0.44–1.00)
GFR, Estimated: >60 mL/min (ref >60)
Glucose, Bld: 109 mg/dL — ABNORMAL HIGH (ref 70–99)
Potassium: 5.3 mmol/L — ABNORMAL HIGH (ref 3.5–5.1)
Sodium: 139 mmol/L (ref 135–145)

## 2024-01-20 LAB — TYPE AND SCREEN
ABO/RH(D): O POS
Antibody Screen: NEGATIVE

## 2024-01-20 LAB — CBC
HCT: 45.6 % (ref 36.0–46.0)
Hemoglobin: 14.8 g/dL (ref 12.0–15.0)
MCH: 31.2 pg (ref 26.0–34.0)
MCHC: 32.5 g/dL (ref 30.0–36.0)
MCV: 96 fL (ref 80.0–100.0)
Platelets: 323 10*3/uL (ref 150–400)
RBC: 4.75 MIL/uL (ref 3.87–5.11)
RDW: 12.9 % (ref 11.5–15.5)
WBC: 8.6 10*3/uL (ref 4.0–10.5)
nRBC: 0 % (ref 0.0–0.2)

## 2024-01-20 MED ORDER — GENTAMICIN SULFATE 40 MG/ML IJ SOLN
5.0000 mg/kg | INTRAVENOUS | Status: AC
  Administered 2024-01-23: 311.6 mg via INTRAVENOUS
  Filled 2024-01-20: qty 7.75

## 2024-01-20 MED ORDER — METRONIDAZOLE 500 MG/100ML IV SOLN
500.0000 mg | INTRAVENOUS | Status: AC
  Administered 2024-01-23: 500 mg via INTRAVENOUS
  Filled 2024-01-20: qty 100

## 2024-01-20 NOTE — Progress Notes (Signed)
 PCP - Dr. Merri Brunette Cardiologist - Dr. Rachelle Hora Croitoru  PPM/ICD - denies  Chest x-ray - 04/13/23 EKG - 04/13/23 Stress Test - denies ECHO - denies Cardiac Cath - denies  Sleep Study - denies   DM- denies  Last dose of GLP1 agonist-  n/a   ASA/Blood Thinner Instructions: n/a   ERAS Protcol - no, NPO   COVID TEST- n/a   Anesthesia review: yes, cardiac hx  Patient denies shortness of breath, fever, cough and chest pain at PAT appointment   All instructions explained to the patient, with a verbal understanding of the material. Patient agrees to go over the instructions while at home for a better understanding.  The opportunity to ask questions was provided.

## 2024-01-20 NOTE — Pre-Procedure Instructions (Signed)
 Surgical Instructions   Your procedure is scheduled on January 23, 2024. Report to [facility] Main Entrance "A" at 5:30 A.M., then check in with the Admitting office. Any questions or running late day of surgery: call [phone]  Questions prior to your surgery date: call [phone], Monday-Friday, 8am-4pm. If you experience any cold or flu symptoms such as cough, fever, chills, shortness of breath, etc. between now and your scheduled surgery, please notify us at the above number.     Remember:  Do not eat or drink after midnight the night before your surgery   Take these medicines the morning of surgery with A SIP OF WATER: escitalopram (LEXAPRO)  levothyroxine (SYNTHROID)     May take these medicines IF NEEDED: ALPRAZolam (XANAX)  fluticasone (FLONASE) nasal spray  methocarbamol (ROBAXIN)  esomeprazole (NEXIUM)  oxyCODONE-acetaminophen (PERCOCET/ROXICET)  SUMAtriptan (IMITREX)  traMADol (ULTRAM)    One week prior to surgery, STOP taking any Aspirin (unless otherwise instructed by your surgeon) Aleve, Naproxen, Ibuprofen, Motrin, Advil, Goody's, BC's, all herbal medications, fish oil, and non-prescription vitamins.                     Do NOT Smoke (Tobacco/Vaping) for 24 hours prior to your procedure.  If you use a CPAP at night, you may bring your mask/headgear for your overnight stay.   You will be asked to remove any contacts, glasses, piercing's, hearing aid's, dentures/partials prior to surgery. Please bring cases for these items if needed.    Patients discharged the day of surgery will not be allowed to drive home, and someone needs to stay with them for 24 hours.  SURGICAL WAITING ROOM VISITATION Patients may have no more than 2 support people in the waiting area - these visitors may rotate.   Pre-op nurse will coordinate an appropriate time for 1 ADULT support person, who may not rotate, to accompany patient in pre-op.  Children under the age of 39 must have an  adult with them who is not the patient and must remain in the main waiting area with an adult.  If the patient needs to stay at the hospital during part of their recovery, the visitor guidelines for inpatient rooms apply.  Please refer to the [facility] website for the visitor guidelines for any additional information.   If you received a COVID test during your pre-op visit  it is requested that you wear a mask when out in public, stay away from anyone that may not be feeling well and notify your surgeon if you develop symptoms. If you have been in contact with anyone that has tested positive in the last 10 days please notify you surgeon.      Pre-operative CHG Bathing Instructions   You can play a key role in reducing the risk of infection after surgery. Your skin needs to be as free of germs as possible. You can reduce the number of germs on your skin by washing with CHG (chlorhexidine gluconate) soap before surgery. CHG is an antiseptic soap that kills germs and continues to kill germs even after washing.   DO NOT use if you have an allergy to chlorhexidine/CHG or antibacterial soaps. If your skin becomes reddened or irritated, stop using the CHG and notify one of our RNs at [phone].              TAKE A SHOWER THE NIGHT BEFORE SURGERY AND THE DAY OF SURGERY    Please keep in mind the following:  DO NOT shave, including legs and underarms, 48 hours prior to surgery.   You may shave your face before/day of surgery.  Place clean sheets on your bed the night before surgery Use a clean washcloth (not used since being washed) for each shower. DO NOT sleep with pet's night before surgery.  CHG Shower Instructions:  Wash your face and private area with normal soap. If you choose to wash your hair, wash first with your normal shampoo.  After you use shampoo/soap, rinse your hair and body thoroughly to remove shampoo/soap residue.  Turn the water OFF and apply half the bottle of CHG soap  to a CLEAN washcloth.  Apply CHG soap ONLY FROM YOUR NECK DOWN TO YOUR TOES (washing for 3-5 minutes)  DO NOT use CHG soap on face, private areas, open wounds, or sores.  Pay special attention to the area where your surgery is being performed.  If you are having back surgery, having someone wash your back for you may be helpful. Wait 2 minutes after CHG soap is applied, then you may rinse off the CHG soap.  Pat dry with a clean towel  Put on clean pajamas    Additional instructions for the day of surgery: DO NOT APPLY any lotions, deodorants, cologne, or perfumes.   Do not wear jewelry or makeup Do not wear nail polish, gel polish, artificial nails, or any other type of covering on natural nails (fingers and toes) Do not bring valuables to the hospital. [facility] is not responsible for valuables/personal belongings. Put on clean/comfortable clothes.  Please brush your teeth.  Ask your nurse before applying any prescription medications to the skin.

## 2024-01-23 ENCOUNTER — Ambulatory Visit (HOSPITAL_COMMUNITY)
Admission: RE | Admit: 2024-01-23 | Discharge: 2024-01-23 | Disposition: A | Discharge status: Home / Self Care | Payer: 59 | Attending: Obstetrics and Gynecology | Admitting: Obstetrics and Gynecology

## 2024-01-23 ENCOUNTER — Other Ambulatory Visit: Payer: Self-pay

## 2024-01-23 ENCOUNTER — Ambulatory Visit (HOSPITAL_BASED_OUTPATIENT_CLINIC_OR_DEPARTMENT_OTHER): Payer: Self-pay | Admitting: Certified Registered"

## 2024-01-23 ENCOUNTER — Other Ambulatory Visit (HOSPITAL_BASED_OUTPATIENT_CLINIC_OR_DEPARTMENT_OTHER): Payer: Self-pay

## 2024-01-23 ENCOUNTER — Ambulatory Visit (HOSPITAL_COMMUNITY): Payer: Self-pay | Admitting: Certified Registered"

## 2024-01-23 ENCOUNTER — Encounter (HOSPITAL_COMMUNITY)
Admission: RE | Disposition: A | Discharge status: Home / Self Care | Payer: Self-pay | Source: Home / Self Care | Attending: Obstetrics and Gynecology

## 2024-01-23 ENCOUNTER — Encounter (HOSPITAL_COMMUNITY): Payer: Self-pay | Admitting: Obstetrics and Gynecology

## 2024-01-23 DIAGNOSIS — N83202 Unspecified ovarian cyst, left side: Secondary | ICD-10-CM

## 2024-01-23 DIAGNOSIS — D271 Benign neoplasm of left ovary: Secondary | ICD-10-CM | POA: Insufficient documentation

## 2024-01-23 DIAGNOSIS — K219 Gastro-esophageal reflux disease without esophagitis: Secondary | ICD-10-CM | POA: Diagnosis not present

## 2024-01-23 DIAGNOSIS — M797 Fibromyalgia: Secondary | ICD-10-CM | POA: Insufficient documentation

## 2024-01-23 SURGERY — EXCISION, CYST, OVARY, LAPAROSCOPIC
Anesthesia: General | Laterality: Left

## 2024-01-23 MED ORDER — OXYCODONE-ACETAMINOPHEN 10-325 MG PO TABS
1.0000 | ORAL_TABLET | Freq: Four times a day (QID) | ORAL | 0 refills | Status: AC | PRN
  Filled 2024-01-23: qty 20, 5d supply, fill #0

## 2024-01-23 MED ORDER — FENTANYL CITRATE (PF) 250 MCG/5ML IJ SOLN
INTRAMUSCULAR | Status: DC | PRN
  Administered 2024-01-23: 50 ug via INTRAVENOUS
  Administered 2024-01-23: 100 ug via INTRAVENOUS

## 2024-01-23 MED ORDER — KETOROLAC TROMETHAMINE 30 MG/ML IJ SOLN
INTRAMUSCULAR | Status: AC
Start: 2024-01-23 — End: ?
  Filled 2024-01-23: qty 1

## 2024-01-23 MED ORDER — SODIUM CHLORIDE 0.9 % IR SOLN
Status: DC | PRN
  Administered 2024-01-23: 1 via INTRAVESICAL

## 2024-01-23 MED ORDER — MIDAZOLAM HCL 2 MG/2ML IJ SOLN
INTRAMUSCULAR | Status: AC
Start: 2024-01-23 — End: ?
  Filled 2024-01-23: qty 2

## 2024-01-23 MED ORDER — SUGAMMADEX SODIUM 200 MG/2ML IV SOLN
INTRAVENOUS | Status: DC | PRN
Start: 2024-01-23 — End: 2024-01-23
  Administered 2024-01-23: 250 mg via INTRAVENOUS

## 2024-01-23 MED ORDER — PROPOFOL 10 MG/ML IV BOLUS
INTRAVENOUS | Status: AC
  Filled 2024-01-23: qty 20

## 2024-01-23 MED ORDER — ORAL CARE MOUTH RINSE: 15.0000 mL | Freq: Once | OROMUCOSAL | Status: AC

## 2024-01-23 MED ORDER — BUPIVACAINE HCL (PF) 0.25 % IJ SOLN
INTRAMUSCULAR | Status: AC
  Filled 2024-01-23: qty 30

## 2024-01-23 MED ORDER — OXYCODONE HCL 5 MG PO TABS: 5.0000 mg | ORAL_TABLET | Freq: Once | ORAL | Status: DC | PRN

## 2024-01-23 MED ORDER — ACETAMINOPHEN 10 MG/ML IV SOLN
INTRAVENOUS | Status: DC | PRN
  Administered 2024-01-23: 900 mg via INTRAVENOUS

## 2024-01-23 MED ORDER — DEXAMETHASONE SODIUM PHOSPHATE 10 MG/ML IJ SOLN
INTRAMUSCULAR | Status: AC
  Filled 2024-01-23: qty 1

## 2024-01-23 MED ORDER — LACTATED RINGERS IV SOLN: INTRAVENOUS | Status: DC

## 2024-01-23 MED ORDER — LIDOCAINE 2% (20 MG/ML) 5 ML SYRINGE
INTRAMUSCULAR | Status: DC | PRN
  Administered 2024-01-23: 40 mg via INTRAVENOUS

## 2024-01-23 MED ORDER — ONDANSETRON HCL 4 MG/2ML IJ SOLN
INTRAMUSCULAR | Status: AC
  Filled 2024-01-23: qty 2

## 2024-01-23 MED ORDER — LIDOCAINE 2% (20 MG/ML) 5 ML SYRINGE
INTRAMUSCULAR | Status: AC
  Filled 2024-01-23: qty 5

## 2024-01-23 MED ORDER — ACETAMINOPHEN 500 MG PO TABS: 1000.0000 mg | ORAL_TABLET | Freq: Once | ORAL | Status: DC | PRN

## 2024-01-23 MED ORDER — MIDAZOLAM HCL 2 MG/2ML IJ SOLN
INTRAMUSCULAR | Status: DC | PRN
  Administered 2024-01-23: 2 mg via INTRAVENOUS

## 2024-01-23 MED ORDER — BUPIVACAINE HCL (PF) 0.25 % IJ SOLN
INTRAMUSCULAR | Status: DC | PRN
  Administered 2024-01-23: 9 mL

## 2024-01-23 MED ORDER — FENTANYL CITRATE (PF) 100 MCG/2ML IJ SOLN
25.0000 ug | INTRAMUSCULAR | Status: DC | PRN
  Administered 2024-01-23: 25 ug via INTRAVENOUS

## 2024-01-23 MED ORDER — SODIUM CHLORIDE (PF) 0.9 % IJ SOLN
INTRAMUSCULAR | Status: DC | PRN
  Administered 2024-01-23: 10 mL

## 2024-01-23 MED ORDER — DEXAMETHASONE SODIUM PHOSPHATE 10 MG/ML IJ SOLN
INTRAMUSCULAR | Status: DC | PRN
  Administered 2024-01-23: 10 mg via INTRAVENOUS

## 2024-01-23 MED ORDER — FENTANYL CITRATE (PF) 100 MCG/2ML IJ SOLN
INTRAMUSCULAR | Status: AC
  Administered 2024-01-23: 25 ug via INTRAVENOUS
  Filled 2024-01-23: qty 2

## 2024-01-23 MED ORDER — OXYCODONE HCL 5 MG/5ML PO SOLN: 5.0000 mg | Freq: Once | ORAL | Status: DC | PRN

## 2024-01-23 MED ORDER — POVIDONE-IODINE 10 % EX SWAB: 2.0000 | Freq: Once | CUTANEOUS | Status: DC

## 2024-01-23 MED ORDER — SODIUM CHLORIDE (PF) 0.9 % IJ SOLN
INTRAMUSCULAR | Status: AC
Start: 2024-01-23 — End: ?
  Filled 2024-01-23: qty 10

## 2024-01-23 MED ORDER — ACETAMINOPHEN 10 MG/ML IV SOLN: 1000.0000 mg | Freq: Once | INTRAVENOUS | Status: DC | PRN

## 2024-01-23 MED ORDER — CHLORHEXIDINE GLUCONATE 0.12 % MT SOLN
15.0000 mL | Freq: Once | OROMUCOSAL | Status: AC
  Administered 2024-01-23: 15 mL via OROMUCOSAL
  Filled 2024-01-23: qty 15

## 2024-01-23 MED ORDER — KETOROLAC TROMETHAMINE 30 MG/ML IJ SOLN
INTRAMUSCULAR | Status: DC | PRN
Start: 2024-01-23 — End: 2024-01-23
  Administered 2024-01-23: 30 mg via INTRAVENOUS

## 2024-01-23 MED ORDER — ROCURONIUM BROMIDE 10 MG/ML (PF) SYRINGE
PREFILLED_SYRINGE | INTRAVENOUS | Status: DC | PRN
  Administered 2024-01-23: 50 mg via INTRAVENOUS

## 2024-01-23 MED ORDER — FENTANYL CITRATE (PF) 250 MCG/5ML IJ SOLN
INTRAMUSCULAR | Status: AC
  Filled 2024-01-23: qty 5

## 2024-01-23 MED ORDER — ONDANSETRON HCL 4 MG/2ML IJ SOLN
INTRAMUSCULAR | Status: DC | PRN
  Administered 2024-01-23: 4 mg via INTRAVENOUS

## 2024-01-23 MED ORDER — ACETAMINOPHEN 160 MG/5ML PO SOLN: 1000.0000 mg | Freq: Once | ORAL | Status: DC | PRN

## 2024-01-23 MED ORDER — PROPOFOL 10 MG/ML IV BOLUS
INTRAVENOUS | Status: DC | PRN
  Administered 2024-01-23: 110 mg via INTRAVENOUS

## 2024-01-23 MED ORDER — ROCURONIUM BROMIDE 10 MG/ML (PF) SYRINGE
PREFILLED_SYRINGE | INTRAVENOUS | Status: AC
  Filled 2024-01-23: qty 10

## 2024-01-23 SURGICAL SUPPLY — 34 items
CABLE HIGH FREQUENCY MONO STRZ (ELECTRODE) IMPLANT
CATH FOLEY LF 14FR (CATHETERS) IMPLANT
CATH ROBINSON RED A/P 16FR (CATHETERS) ×1 IMPLANT
CNTNR URN SCR LID CUP LEK RST (MISCELLANEOUS) IMPLANT
DERMABOND ADVANCED .7 DNX12 (GAUZE/BANDAGES/DRESSINGS) IMPLANT
DRAPE SURG IRRIG POUCH 19X23 (DRAPES) ×1 IMPLANT
DRSG OPSITE POSTOP 3X4 (GAUZE/BANDAGES/DRESSINGS) IMPLANT
DURAPREP 26ML APPLICATOR (WOUND CARE) ×1 IMPLANT
FILTER SMOKE EVAC LAPAROSHD (FILTER) IMPLANT
GLOVE BIO SURGEON STRL SZ 6.5 (GLOVE) ×1 IMPLANT
GOWN STRL REUS W/ TWL LRG LVL3 (GOWN DISPOSABLE) ×2 IMPLANT
IRRIG SUCT STRYKERFLOW 2 WTIP (MISCELLANEOUS) IMPLANT
IRRIGATION SUCT STRKRFLW 2 WTP (MISCELLANEOUS) IMPLANT
KIT PINK PAD W/HEAD ARE REST (MISCELLANEOUS) ×1 IMPLANT
KIT PINK PAD W/HEAD ARM REST (MISCELLANEOUS) ×1 IMPLANT
KIT TURNOVER KIT B (KITS) ×1 IMPLANT
NDL INSUFFLATION 14GA 120MM (NEEDLE) ×1 IMPLANT
NEEDLE INSUFFLATION 14GA 120MM (NEEDLE) ×1 IMPLANT
NS IRRIG 1000ML POUR BTL (IV SOLUTION) ×1 IMPLANT
PACK LAPAROSCOPY BASIN (CUSTOM PROCEDURE TRAY) ×1 IMPLANT
SCISSORS LAP 5X45 EPIX DISP (ENDOMECHANICALS) IMPLANT
SEALER TISSUE G2 CVD JAW 35 (ENDOMECHANICALS) IMPLANT
SEALER TISSUE G2 CVD JAW 45CM (ENDOMECHANICALS) IMPLANT
SET TUBE SMOKE EVAC HIGH FLOW (TUBING) ×1 IMPLANT
SLEEVE Z-THREAD 5X100MM (TROCAR) IMPLANT
SUT VIC AB 3-0 PS2 18XBRD (SUTURE) IMPLANT
SUT VIC AB 4-0 PS2 27 (SUTURE) IMPLANT
SUT VICRYL 0 UR6 27IN ABS (SUTURE) IMPLANT
SYS BAG RETRIEVAL 10MM (BASKET) IMPLANT
SYSTEM BAG RETRIEVAL 10MM (BASKET) IMPLANT
TOWEL GREEN STERILE FF (TOWEL DISPOSABLE) ×2 IMPLANT
TROCAR OPTI TIP 5M 100M (ENDOMECHANICALS) ×1 IMPLANT
TROCAR XCEL DIL TIP R 11M (ENDOMECHANICALS) ×1 IMPLANT
WARMER LAPAROSCOPE (MISCELLANEOUS) ×1 IMPLANT

## 2024-01-23 NOTE — Brief Op Note (Signed)
 01/23/2024  8:38 AM  PATIENT:  [name]  63 y.o. female  PRE-OPERATIVE DIAGNOSIS: Left ovarian Cyst   POST-OPERATIVE DIAGNOSIS:  LEFT OVARIAN CYST    Procedure: Laparoscopy Left ovarian cystectomy  SURGEON:  Surgeons and Role:    * Marcelle Overlie, MD - Primary  PHYSICIAN ASSISTANT:   ASSISTANTS: none   ANESTHESIA:   general  EBL:  per anesthesia record   BLOOD ADMINISTERED:none  DRAINS: none   LOCAL MEDICATIONS USED:  LIDOCAINE   SPECIMEN:  Source of Specimen:  cyst wall  DISPOSITION OF SPECIMEN:  PATHOLOGY  COUNTS:  YES  TOURNIQUET:  * No tourniquets in log *  DICTATION: .Other Dictation: Dictation Number dictated  PLAN OF CARE: Discharge to home after PACU  PATIENT DISPOSITION:  PACU - hemodynamically stable.   Delay start of Pharmacological VTE agent (>24hrs) due to surgical blood loss or risk of bleeding: not applicable

## 2024-01-23 NOTE — Discharge Instructions (Addendum)
 DO NOT TAKE MOTRIN UNTIL AFTER 2:30 PM today.    Post Anesthesia Home Care Instructions  Activity: Get plenty of rest for the remainder of the day. A responsible adult should stay with you for 24 hours following the procedure.  For the next 24 hours, DO NOT: -Drive a car -Advertising copywriter -Drink alcoholic beverages -Take any medication unless instructed by your physician -Make any legal decisions or sign important papers.  Meals: Start with liquid foods such as gelatin or soup. Progress to regular foods as tolerated. Avoid greasy, spicy, heavy foods. If nausea and/or vomiting occur, drink only clear liquids until the nausea and/or vomiting subsides. Call your physician if vomiting continues.  Special Instructions/Symptoms: Your throat may feel dry or sore from the anesthesia or the breathing tube placed in your throat during surgery. If this causes discomfort, gargle with warm salt water. The discomfort should disappear within 24 hours.  If you had a scopolamine patch placed behind your ear for the management of post- operative nausea and/or vomiting:  1. The medication in the patch is effective for 72 hours, after which it should be removed.  Wrap patch in a tissue and discard in the trash. Wash hands thoroughly with soap and water. 2. You may remove the patch earlier than 72 hours if you experience unpleasant side effects which may include dry mouth, dizziness or visual disturbances. 3. Avoid touching the patch. Wash your hands with soap and water after contact with the patch.  Call your surgeon if you experience:   1.  Fever over 101.0. 2.  Inability to urinate. 3.  Nausea and/or vomiting. 4.  Extreme swelling or bruising at the surgical site. 5.  Continued bleeding from the incision. 6.  Increased pain, redness or drainage from the incision. 7.  Problems related to your pain medication. 8. Any change in color, movement and/or sensation 9. Any problems and/or concerns

## 2024-01-23 NOTE — H&P (Signed)
 63 year old female presents with 5.6 cm simple left adnexal cyst.  Patient is symptomatic with pelvic pain.   Past Medical History:  Diagnosis Date   ADHD (attention deficit hyperactivity disorder)    Anxiety    Appendicitis    Arthritis    left sided   Burning tongue syndrome 03/15/2016   Common migraine without intractability 03/15/2016   Depression    Elevated coronary artery calcium score 04/30/2022   Follows w/ Dr.  Royann Shivers, cardiologist.   Family history of adverse reaction to anesthesia    mother took "extra" time to wake up from anesthesia   Fibromyalgia    GERD (gastroesophageal reflux disease)    History of kidney stones    hx of kidney stones w/ stent placement in 2010   Hypothyroidism    Follows w/ PCP @ [facility].   Mixed hyperlipidemia    Follows w/ cardiology.   Rectocele    Past Surgical History:  Procedure Laterality Date   APPENDECTOMY     COLONOSCOPY     DILATION AND CURETTAGE OF UTERUS     LITHOTRIPSY     stent placed   WISDOM TOOTH EXTRACTION     Scheduled Meds:  povidone-iodine  2 Application Topical Once   Continuous Infusions:  metronidazole     And   gentamicin     lactated ringers     lactated ringers     PRN Meds:. Allergies Amoxicillin-pot clavulanate, Escitalopram oxalate, Venlafaxine, Erythromycin, and Latex Social History   Socioeconomic History   Marital status: Married    Spouse name: Not on file   Number of children: 3   Years of education: 16   Highest education level: Not on file  Occupational History   Occupation: Clinical Specialist    Employer: [employer]  Tobacco Use   Smoking status: Never   Smokeless tobacco: Never  Vaping Use   Vaping status: Never Used  Substance and Sexual Activity   Alcohol use: Yes    Alcohol/week: 2.0 standard drinks of alcohol    Types: 2 Glasses of wine per week   Drug use: No   Sexual activity: Not on file  Other Topics Concern   Not on file  Social History Narrative    Lives at home w/ husband and childre   Right-handed   1-2 cups of coffee per day   Social Drivers of Corporate investment banker Strain: Not on file  Food Insecurity: Not on file  Transportation Needs: Not on file  Physical Activity: Not on file  Stress: Not on file  Social Connections: Unknown (04/06/2023)   [facility]   Social Network    Social Network: Not on file   Family History  Problem Relation Age of Onset   Esophageal cancer Mother    Pancreatic cancer Father    Breast cancer Sister    Heart disease Brother    Colon cancer Neg Hx    Rectal cancer Neg Hx    Stomach cancer Neg Hx    BP 129/87   Pulse 63   Temp 97.8 F (36.6 C) (Oral)   Resp 17   Ht 5\' 2"  (1.575 m)   Wt 62.1 kg   LMP 12/20/2013   SpO2 97%   BMI 25.06 kg/m  No results found for this or any previous visit (from the past 24 hours). General alert and oriented Lung CTAB Car RRR  Abdomen is soft and non tender Left adnexal  cyst on exam  IMPRESSION: Left adnexal cyst   Plan: Laparoscopy Removal of left adnexal cyst

## 2024-01-23 NOTE — Transfer of Care (Signed)
 Immediate Anesthesia Transfer of Care Note  Patient: Angela Good  Procedure(s) Performed: EXCISION, CYST, OVARY, LAPAROSCOPIC (Left)  Patient Location: PACU  Anesthesia Type:General  Level of Consciousness: awake  Airway & Oxygen Therapy: Patient Spontanous Breathing and Patient connected to nasal cannula oxygen  Post-op Assessment: Report given to RN and Post -op Vital signs reviewed and stable  Post vital signs: Reviewed and stable  Last Vitals:  Vitals Value Taken Time  BP 151/99 01/23/24 0849  Temp    Pulse 81 01/23/24 0851  Resp 16 01/23/24 0851  SpO2 97 % 01/23/24 0851  Vitals shown include unfiled device data.  Last Pain:  Vitals:   01/23/24 0623  TempSrc: Oral  PainSc:       Patients Stated Pain Goal: 2 (01/23/24 0606)  Complications: No notable events documented.

## 2024-01-23 NOTE — Anesthesia Preprocedure Evaluation (Signed)
 Anesthesia Evaluation  Patient identified by MRN, date of birth, ID band Patient awake    Reviewed: Allergy & Precautions, NPO status , Patient's Chart, lab work & pertinent test results  History of Anesthesia Complications Negative for: history of anesthetic complications  Airway Mallampati: II  TM Distance: >3 FB Neck ROM: Full    Dental  (+) Teeth Intact, Dental Advisory Given   Pulmonary neg shortness of breath, neg sleep apnea, neg COPD, neg recent URI   breath sounds clear to auscultation       Cardiovascular METS: > 9 Mets (-) hypertension(-) angina + CAD  (-) Past MI and (-) CHF  Rhythm:Regular     Neuro/Psych  Headaches PSYCHIATRIC DISORDERS Anxiety Depression     Neuromuscular disease    GI/Hepatic Neg liver ROS,GERD  Medicated and Controlled,,  Endo/Other  Hypothyroidism    Renal/GU negative Renal ROS     Musculoskeletal  (+) Arthritis ,  Fibromyalgia -  Abdominal   Peds  Hematology negative hematology ROS (+) Lab Results      Component                Value               Date                      WBC                      8.6                 01/20/2024                HGB                      14.8                01/20/2024                HCT                      45.6                01/20/2024                MCV                      96.0                01/20/2024                PLT                      323                 01/20/2024              Anesthesia Other Findings   Reproductive/Obstetrics                              Anesthesia Physical Anesthesia Plan  ASA: 2  Anesthesia Plan: General   Post-op Pain Management: Toradol IV (intra-op)* and Ofirmev IV (intra-op)*   Induction: Intravenous  PONV Risk Score and Plan: 4 or greater and Ondansetron, Dexamethasone and Midazolam  Airway Management Planned: Oral ETT  Additional Equipment: None  Intra-op Plan:    Post-operative Plan: Extubation  in OR  Informed Consent: I have reviewed the patients History and Physical, chart, labs and discussed the procedure including the risks, benefits and alternatives for the proposed anesthesia with the patient or authorized representative who has indicated his/her understanding and acceptance.     Dental advisory given  Plan Discussed with: CRNA  Anesthesia Plan Comments:          Anesthesia Quick Evaluation

## 2024-01-23 NOTE — Anesthesia Procedure Notes (Signed)
 Procedure Name: Intubation Date/Time: 01/23/2024 7:39 AM  Performed by: Einar Grad, CRNAPre-anesthesia Checklist: Patient identified, Emergency Drugs available, Suction available and Patient being monitored Patient Re-evaluated:Patient Re-evaluated prior to induction Oxygen Delivery Method: Circle System Utilized Preoxygenation: Pre-oxygenation with 100% oxygen Induction Type: IV induction Ventilation: Mask ventilation without difficulty Laryngoscope Size: Mac and 3 Grade View: Grade II Tube type: Oral Number of attempts: 1 Airway Equipment and Method: Stylet and Oral airway Placement Confirmation: ETT inserted through vocal cords under direct vision, positive ETCO2 and breath sounds checked- equal and bilateral Secured at: 22 cm Tube secured with: Tape Dental Injury: Teeth and Oropharynx as per pre-operative assessment

## 2024-01-23 NOTE — Op Note (Signed)
 NAMEJAMEICA, Angela Good MEDICAL RECORD NO: [id number] ACCOUNT NO: [id number] DATE OF BIRTH: Aug 02, 1961 FACILITY: [facility] LOCATION: [facility] PHYSICIAN: Cauy Melody L. Vincente Poli, MD  Operative Report   DATE OF PROCEDURE: 01/23/2024   PREOPERATIVE DIAGNOSIS: Left adnexal cyst.  POSTOPERATIVE DIAGNOSIS: Left adnexal cyst.  PROCEDURE: Laparoscopy and left ovarian cystectomy.  SURGEON: Octavious Zidek L. Vincente Poli, MD  ANESTHESIA: General with local.  ESTIMATED BLOOD LOSS: Per anesthesia record.  COMPLICATIONS: None.  DRAINS: None.  PATHOLOGY: Cyst wall.  DESCRIPTION OF PROCEDURE: The patient was taken to the operating room. After she was intubated, she was prepped and draped and a timeout was performed. The bladder was emptied of clear urine. A uterine manipulator was inserted. Attention was turned to  the abdomen. A small incision was made at the umbilicus after local was infiltrated. The Veress needle was inserted and pneumoperitoneum was performed. The Veress needle was removed. An 11 mm trocar was inserted. The patient was placed in Trendelenburg  position. The scope was introduced and there was noted to be no area of bleeding or injury. The pelvis was inspected. The uterus was small and appeared to be normal and the right tube and ovary were very normal in appearance. A small secondary site was  placed suprapubically under direct, a trocar was inserted under direct visualization. Using a blunt probe, we can inspect the left adnexa. There was an enlarged clear walled, smooth walled cyst on the left side. This appeared to be incorporated into the  ovary. It had all benign features and it appeared to be about 6 cm consistent with the preoperative ultrasound that was performed in the office. Using scissors with electrocautery, we made an opening in the cyst wall. Straw-colored fluid was retrieved  and expelled. The cyst had nicely decompressed. We can then see that this was part of the ovary. I then elevated  the cyst wall and used hot scissors and removed the cyst wall with retaining the vast majority of the ovary. This was done with excellent  hemostasis. The Nezhat  was inserted and all the fluid was suctioned and irrigated. The cyst wall was sent to Pathology for analysis, but I feel like it is benign. The inside of the cyst was smooth walled as well. We then released the gas, removed all  trocars, closed the umbilical incision with one stitch of the fascia with 0 Vicryl suture and then the skin with 3-0 Vicryl in a running stitch and then closed the suprapubic in a subcuticular fashion. Dermabond was applied. All instruments were removed  from the vagina. All sponge, lap and instrument counts were correct x2. The patient went to the recovery room in stable condition.    Xaver.Mink D: 01/23/2024 8:44:50 am T: 01/23/2024 9:17:00 am  JOB: [id number]/ [id number]

## 2024-01-24 ENCOUNTER — Encounter (HOSPITAL_COMMUNITY): Payer: Self-pay | Admitting: Obstetrics and Gynecology

## 2024-01-25 LAB — SURGICAL PATHOLOGY

## 2024-01-26 NOTE — Anesthesia Postprocedure Evaluation (Signed)
 Anesthesia Post Note  Patient: Angela Good  Procedure(s) Performed: EXCISION, CYST, OVARY, LAPAROSCOPIC (Left)     Patient location during evaluation: PACU Anesthesia Type: General Level of consciousness: awake and alert Pain management: pain level controlled Vital Signs Assessment: post-procedure vital signs reviewed and stable Respiratory status: spontaneous breathing, nonlabored ventilation and respiratory function stable Cardiovascular status: blood pressure returned to baseline and stable Postop Assessment: no apparent nausea or vomiting Anesthetic complications: no   No notable events documented.                Dywane Peruski

## 2024-02-01 ENCOUNTER — Other Ambulatory Visit (HOSPITAL_BASED_OUTPATIENT_CLINIC_OR_DEPARTMENT_OTHER): Payer: Self-pay

## 2024-02-01 MED ORDER — AMPHETAMINE-DEXTROAMPHETAMINE 20 MG PO TABS
20.0000 mg | ORAL_TABLET | Freq: Three times a day (TID) | ORAL | 0 refills | Status: DC
  Filled 2024-02-01: qty 90, 30d supply, fill #0

## 2024-03-01 ENCOUNTER — Other Ambulatory Visit (HOSPITAL_BASED_OUTPATIENT_CLINIC_OR_DEPARTMENT_OTHER): Payer: Self-pay

## 2024-03-12 ENCOUNTER — Other Ambulatory Visit (HOSPITAL_BASED_OUTPATIENT_CLINIC_OR_DEPARTMENT_OTHER): Payer: Self-pay

## 2024-03-22 ENCOUNTER — Other Ambulatory Visit (HOSPITAL_BASED_OUTPATIENT_CLINIC_OR_DEPARTMENT_OTHER): Payer: Self-pay

## 2024-03-22 ENCOUNTER — Telehealth: Payer: Self-pay | Admitting: Cardiovascular Disease

## 2024-03-22 DIAGNOSIS — R931 Abnormal findings on diagnostic imaging of heart and coronary circulation: Secondary | ICD-10-CM

## 2024-03-22 DIAGNOSIS — E782 Mixed hyperlipidemia: Secondary | ICD-10-CM

## 2024-03-22 NOTE — Telephone Encounter (Signed)
 Spoke with patient and she states she has been sick for over a week with Flu A and she states she took her Repatha .   She states before the Flu she didn't like how she feels on repatha  and just want to stop taking it. I asked her can she explain she states it was causing her back pain but then state she has had back pain  before she starting repatha . She then states it was making her muscle pain worse.

## 2024-03-22 NOTE — Telephone Encounter (Signed)
 Pt c/o medication issue:  1. Name of Medication:   Evolocumab  (REPATHA  SURECLICK) 140 MG/ML SOAJ    2. How are you currently taking this medication (dosage and times per day)? Inject 140 mg into the skin every 14 (fourteen) days.   3. Are you having a reaction (difficulty breathing--STAT)? No  4. What is your medication issue? Patient stated she is sick with the Flu A and she took this medication on Sunday. Patient thinks this medication has caused her to become sicker. Patient is concerned, please advise.

## 2024-03-23 NOTE — Telephone Encounter (Signed)
 Left voicemail to return call to office

## 2024-03-23 NOTE — Telephone Encounter (Signed)
 I am not entirely sure that the side effects are from the Repatha , but she has been complaining of it for a while.   She has a very high calcium  score and a family history of very premature onset coronary disease.  Unfortunately does not tolerate statins.  We can try switching to Nexletol (may not be strong enough to get to target but it is worth a try).  Another option would be Leqvio if her insurance covers it.   Please stop Repatha  and prescribe Nexletol 180 mg once daily, with a repeat lipid profile in 3 months

## 2024-03-28 NOTE — Telephone Encounter (Signed)
 Left message for patient to callback. Also sent patient message via MyChart with Dr. Leola Raisin recommendation.

## 2024-03-29 ENCOUNTER — Other Ambulatory Visit (HOSPITAL_BASED_OUTPATIENT_CLINIC_OR_DEPARTMENT_OTHER): Payer: Self-pay

## 2024-03-29 MED ORDER — AMPHETAMINE-DEXTROAMPHETAMINE 20 MG PO TABS
20.0000 mg | ORAL_TABLET | Freq: Three times a day (TID) | ORAL | 0 refills | Status: DC
  Filled 2024-03-29: qty 90, 30d supply, fill #0

## 2024-04-02 ENCOUNTER — Other Ambulatory Visit (HOSPITAL_BASED_OUTPATIENT_CLINIC_OR_DEPARTMENT_OTHER): Payer: Self-pay

## 2024-04-04 NOTE — Telephone Encounter (Signed)
 Thanks. Recheck a lipid profile after 2-3 months on Repatha 

## 2024-04-04 NOTE — Telephone Encounter (Signed)
 Lipid panel ordered- will remind pt when gets closer to time to repeat labs

## 2024-04-11 ENCOUNTER — Encounter: Payer: Self-pay | Admitting: Cardiovascular Disease

## 2024-04-26 ENCOUNTER — Other Ambulatory Visit (HOSPITAL_BASED_OUTPATIENT_CLINIC_OR_DEPARTMENT_OTHER): Payer: Self-pay

## 2024-05-07 ENCOUNTER — Other Ambulatory Visit (HOSPITAL_BASED_OUTPATIENT_CLINIC_OR_DEPARTMENT_OTHER): Payer: Self-pay

## 2024-05-07 MED ORDER — ALPRAZOLAM 1 MG PO TABS
1.0000 mg | ORAL_TABLET | Freq: Two times a day (BID) | ORAL | 2 refills | Status: DC | PRN
  Filled 2024-05-07: qty 60, 30d supply, fill #0
  Filled 2024-07-06: qty 60, 30d supply, fill #1
  Filled 2024-08-29: qty 60, 30d supply, fill #2

## 2024-05-17 ENCOUNTER — Other Ambulatory Visit (HOSPITAL_BASED_OUTPATIENT_CLINIC_OR_DEPARTMENT_OTHER): Payer: Self-pay

## 2024-05-18 ENCOUNTER — Encounter: Payer: Self-pay | Admitting: Internal Medicine

## 2024-06-20 ENCOUNTER — Other Ambulatory Visit: Payer: Self-pay

## 2024-06-20 ENCOUNTER — Other Ambulatory Visit (HOSPITAL_BASED_OUTPATIENT_CLINIC_OR_DEPARTMENT_OTHER): Payer: Self-pay

## 2024-07-05 ENCOUNTER — Telehealth: Payer: Self-pay | Admitting: Internal Medicine

## 2024-07-05 NOTE — Telephone Encounter (Signed)
 Inbound call from patient stating that she was having a lot of LLQ pain and her pain is severe to the point where it radiates to her back. Patient was seen with her OBGYN and found her had a cyst but did not say where the cyst was located at. Patient stated that her OBGYN believes she has Diverticulitis and would like her to be seen with us  sooner then the appointment she already has schedule for September the 11 th. Patient seems to be very concerned and would like to speak to the nurse. Please advise.

## 2024-07-05 NOTE — Telephone Encounter (Signed)
 Pt states she was seen by her gyn and found to have a cyst but was also told she may have diverticulitis. She was given augmentin which she took last night and then vomited. Reports she called their office back and was told to contact gi. Pt scheduled to see Ellouise Console PA tomorrow at 3pm, Pt aware of appt and knows to go to the er if the pain intensifies. She also wants to discuss endo/colon at visit.

## 2024-07-06 ENCOUNTER — Other Ambulatory Visit: Payer: Self-pay | Admitting: Emergency Medicine

## 2024-07-06 ENCOUNTER — Encounter: Payer: Self-pay | Admitting: Emergency Medicine

## 2024-07-06 ENCOUNTER — Ambulatory Visit: Admitting: Physician Assistant

## 2024-07-06 DIAGNOSIS — R931 Abnormal findings on diagnostic imaging of heart and coronary circulation: Secondary | ICD-10-CM

## 2024-07-06 DIAGNOSIS — E782 Mixed hyperlipidemia: Secondary | ICD-10-CM

## 2024-07-09 ENCOUNTER — Other Ambulatory Visit: Payer: Self-pay

## 2024-07-09 NOTE — Telephone Encounter (Signed)
 I had like to have the lipid profile done before the October 3 appointment so we can go over the results together. I do not think a repeat CT is really necessary.  I do not think Angela Good has ever smoked which makes her a low risk patient.  In that situation, 2 mm lung nodules are certain to be benign and a repeat CAT scan is not necessary.

## 2024-07-11 LAB — LIPID PANEL
Chol/HDL Ratio: 2.2 ratio (ref 0.0–4.4)
Cholesterol, Total: 133 mg/dL (ref 100–199)
HDL: 60 mg/dL (ref >39)
LDL Chol Calc (NIH): 51 mg/dL (ref 0–99)
Triglycerides: 126 mg/dL (ref 0–149)
VLDL Cholesterol Cal: 22 mg/dL (ref 5–40)

## 2024-07-12 ENCOUNTER — Ambulatory Visit: Admitting: Nurse Practitioner

## 2024-07-12 ENCOUNTER — Ambulatory Visit: Payer: Self-pay | Admitting: Cardiovascular Disease

## 2024-07-12 NOTE — Progress Notes (Unsigned)
 Ellouise Console, PA-C [address] [place], [place]  [address] Phone: [phone]   Gastroenterology Consultation  Referring Provider:     Claudene Pellet, MD Primary Care Physician:  Claudene Pellet, MD Primary Gastroenterologist:  Ellouise Console, PA-C / Norleen Kiang, MD  Reason for Consultation:     Left lower quadrant abdominal pain        HPI:   Angela Good is a 63 y.o. y/o female referred for consultation & management  by Claudene Pellet, MD. She is here today with her husband.  PCP Dr. Pellet Claudene.  GYN Dr. Raguel.  Patient was evaluated by her GYN Dr. Rosaline Cobble.  Diagnosed with 5.6 cm left ovarian cyst.  Underwent laparoscopy and removal of left adnexal cyst 01/23/2024.  She has had persistent abdominal discomfort and bloating since surgery.  In the past few weeks she was diagnosed with possible diverticulitis and was treated with Augmentin which caused nausea and vomiting.  She was switched to metronidazole  and doxycycline in the past week.  She continues to have persistent LLQ and LUQ abdominal pain.  Has also had some left mid back pain for 2 days.  Has episodes of diarrhea alternating with constipation.  Denies melena or hematochezia.  She has been on liquid diet.  She is worried about persistent diverticulitis..  She has had previous appendectomy.  History of IBS.  Patient also has episodes of acid reflux, belching, and dysphagia.  She is requesting to schedule EGD.  Family history significant for her mother who had esophageal cancer.  Father had pancreatic cancer.  1 Sister had breast cancer and another sister had lymphoma.  Patient is currently taking Nexium  40 mg daily with occasional breakthrough GERD symptoms.  Currently has upper respiratory infection.  Currently wearing a mask.  No recent abdominal pelvic CT.  12/2013 last colonoscopy by Dr. Kiang: Normal.  No polyps.  10-year repeat (was due 12/2023).  12/2013 EGD by Dr. Kiang (for dysphagia and GERD):  Normal.  Past Medical History:  Diagnosis Date   ADHD (attention deficit hyperactivity disorder)    Anxiety    Appendicitis    Arthritis    left sided   Burning tongue syndrome 03/15/2016   Common migraine without intractability 03/15/2016   Depression    Elevated coronary artery calcium  score 04/30/2022   Follows w/ Dr.  Francyne, cardiologist.   Family history of adverse reaction to anesthesia    mother took extra time to wake up from anesthesia   Fibromyalgia    GERD (gastroesophageal reflux disease)    History of kidney stones    hx of kidney stones w/ stent placement in 2010   Hypothyroidism    Follows w/ PCP @ [facility].   Mixed hyperlipidemia    Follows w/ cardiology.   Rectocele     Past Surgical History:  Procedure Laterality Date   APPENDECTOMY     COLONOSCOPY     DILATION AND CURETTAGE OF UTERUS     LAPAROSCOPIC OVARIAN CYSTECTOMY Left 01/23/2024   Procedure: EXCISION, CYST, OVARY, LAPAROSCOPIC;  Surgeon: Cobble Rosaline, MD;  Location: [facility] OR;  Service: Gynecology;  Laterality: Left;   LITHOTRIPSY     stent placed   WISDOM TOOTH EXTRACTION      Prior to Admission medications   Medication Sig Start Date End Date Taking? Authorizing Provider  ALPRAZolam  (XANAX ) 1 MG tablet Take 1 tablet (1 mg total) by mouth 2 (two) times daily as needed. 05/07/24  amphetamine -dextroamphetamine  (ADDERALL) 20 MG tablet Take 1 tablet (20 mg total) by mouth 3 (three) times daily. 03/29/24     escitalopram (LEXAPRO) 20 MG tablet Take 10 mg by mouth daily.    [provider]  esomeprazole  (NEXIUM ) 40 MG capsule Take 1 capsule (40 mg total) by mouth 2 (two) times daily. Patient taking differently: Take 40 mg by mouth daily as needed (reflux). 09/11/13   Abran Norleen SAILOR, MD  Evolocumab  (REPATHA  SURECLICK) 140 MG/ML SOAJ Inject 140 mg into the skin every 14 (fourteen) days. 11/16/23   Croitoru, Mihai, MD  fluticasone (FLONASE) 50 MCG/ACT nasal spray Place 1 spray into  both nostrils daily as needed for allergies. 08/05/15   [provider]  levothyroxine (SYNTHROID) 88 MCG tablet Take 88 mcg by mouth daily. 03/22/22   [provider]  methocarbamol (ROBAXIN) 500 MG tablet Take 500-1,000 mg by mouth daily as needed for muscle spasms.    [provider]  oxyCODONE -acetaminophen  (PERCOCET) 10-325 MG tablet Take 1 tablet by mouth every 6 (six) hours as needed for pain. 01/23/24 01/22/25  Mat Browning, MD  Prasterone (INTRAROSA) 6.5 MG INST Intrarosa 6.5 mg vaginal insert Patient not taking: Reported on 04/22/2023    [provider]  PRESCRIPTION MEDICATION Apply 1 Application topically in the morning and at bedtime. #4 AZELAIC ACID 15%/IVERMECTIN 1%/METRONIDAZOLE  1% TOPICAL GEL (PERME8/WO6 ANHYDROUS)    [provider]  SUMAtriptan (IMITREX) 100 MG tablet Take 100 mg by mouth every 2 (two) hours as needed for migraine.    [provider]  traMADol (ULTRAM) 50 MG tablet Take 50 mg by mouth daily as needed for moderate pain (pain score 4-6) or severe pain (pain score 7-10). 11/01/23   [provider]  zolpidem (AMBIEN) 10 MG tablet Take 10 mg by mouth at bedtime as needed. 01/18/24   [provider]    Family History  Problem Relation Age of Onset   Esophageal cancer Mother    Pancreatic cancer Father    Breast cancer Sister    Heart disease Brother    Colon cancer Neg Hx    Rectal cancer Neg Hx    Stomach cancer Neg Hx      Social History   Tobacco Use   Smoking status: Never   Smokeless tobacco: Never  Vaping Use   Vaping status: Never Used  Substance Use Topics   Alcohol use: Yes    Alcohol/week: 2.0 standard drinks of alcohol    Types: 2 Glasses of wine per week   Drug use: No    Allergies as of 07/13/2024 - Review Complete 07/13/2024  Allergen Reaction Noted   Amoxicillin-pot clavulanate Nausea And Vomiting 01/09/2014   Escitalopram oxalate Palpitations 01/09/2014    Venlafaxine Palpitations 09/06/2013   Erythromycin Nausea And Vomiting and Nausea Only 07/24/2013   Latex Other (See Comments) and Rash 01/09/2014   Crestor  [rosuvastatin ] Other (See Comments) 07/13/2024   Evolocumab   07/13/2024   Influenza virus vaccine Other (See Comments) 07/13/2024    Review of Systems:    All systems reviewed and negative except where noted in HPI.   Physical Exam:  BP 130/70   Pulse 95   Ht 5' 2.5 (1.588 m)   Wt 133 lb (60.3 kg)   LMP 12/20/2013   BMI 23.94 kg/m  Patient's last menstrual period was 12/20/2013.  General:   Alert,  Well-developed, well-nourished, pleasant and cooperative in NAD Lungs:  Respirations even and unlabored.  Clear throughout to auscultation.  No wheezes, crackles, or rhonchi. No acute distress. Heart:  Regular rate and rhythm; no murmurs, clicks, rubs, or gallops. Abdomen:  Normal bowel sounds.  No bruits.  Soft, and non-distended without masses, hepatosplenomegaly or hernias noted.  Mild to moderate LUQ and LLQ tenderness.  No right sided abdominal tenderness.  No guarding or rebound tenderness.    Neurologic:  Alert and oriented x3;  grossly normal neurologically. Psych:  Alert and cooperative.  Anxious mood and affect.  Imaging Studies: No results found.  Labs: CBC    Component Value Date/Time   WBC 7.6 07/13/2024 1429   RBC 4.61 07/13/2024 1429   HGB 14.6 07/13/2024 1429   HCT 43.7 07/13/2024 1429   PLT 267.0 07/13/2024 1429   MCV 94.9 07/13/2024 1429    CMP     Component Value Date/Time   NA 136 07/13/2024 1429   NA 143 08/31/2022 1002   K 4.3 07/13/2024 1429   CL 100 07/13/2024 1429   CO2 29 07/13/2024 1429   GLUCOSE 111 (H) 07/13/2024 1429   BUN 13 07/13/2024 1429   BUN 15 08/31/2022 1002   CREATININE 0.84 07/13/2024 1429   CALCIUM  9.3 07/13/2024 1429   PROT 6.8 07/13/2024 1429   ALBUMIN 4.2 07/13/2024 1429   AST 24 07/13/2024 1429   ALT 27 07/13/2024 1429   ALKPHOS 77 07/13/2024 1429   BILITOT 0.3  07/13/2024 1429   GFRNONAA >60 01/20/2024 0900   GFRAA  07/30/2010 0104    >60        The eGFR has been calculated using the MDRD equation. This calculation has not been validated in all clinical situations. eGFR's persistently <60 mL/min signify possible Chronic Kidney Disease.    Assessment and Plan:   Talasia Saulter is a 63 y.o. y/o female has been referred for a multitude of GI symptoms.  I am most suspicious for irritable bowel syndrome and acid reflux.  Diverticulitis is in the differential.  She has family history of mom with esophageal cancer and father with pancreatic cancer.  Currently due for 10-year repeat screening colonoscopy.  I am ordering abdominal pelvic CT to evaluate for diverticulitis and screen for pancreatic cancer.  If CT is unrevealing, then we will go ahead and schedule repeat screening colonoscopy and EGD.  Continue treatment for IBS and GERD.  1.  LLQ and LUQ abdominal pain; evaluate for diverticulitis - Labs CBC, CMP - CT abdomen pelvis with contrast  2.  History of left ovarian cyst s/p surgical removal 12/2023 by Dr. Mat  3.  Colon cancer screening - If abdominal pelvic CT shows no diverticulitis, then plan to schedule 10-year repeat screening colonoscopy with Dr. Abran in [facility].  4.  Chronic intermittent GERD with vague dysphagia symptoms - If CT is unrevealing, then schedule EGD with Dr. Abran. - Continue Nexium  40 mg once daily. - GERD diet.  5.  Family history of mother with esophageal cancer. - If abdominal CT is negative, then schedule EGD with Dr. Abran in [facility].  6.  Family history of father with pancreatic cancer. - Schedule abdominal CT.  7.  Irritable bowel syndrome with diarrhea and constipation - Pending CT results, then decide about further treatment.  Follow up based on test results and GI symptoms.  Ellouise Console, PA-C

## 2024-07-13 ENCOUNTER — Ambulatory Visit: Admitting: Physician Assistant

## 2024-07-13 ENCOUNTER — Encounter: Payer: Self-pay | Admitting: Physician Assistant

## 2024-07-13 ENCOUNTER — Other Ambulatory Visit (INDEPENDENT_AMBULATORY_CARE_PROVIDER_SITE_OTHER)

## 2024-07-13 ENCOUNTER — Ambulatory Visit: Payer: Self-pay | Admitting: Physician Assistant

## 2024-07-13 ENCOUNTER — Telehealth: Payer: Self-pay | Admitting: Physician Assistant

## 2024-07-13 VITALS — BP 130/70 | HR 95 | Ht 62.5 in | Wt 133.0 lb

## 2024-07-13 DIAGNOSIS — R1012 Left upper quadrant pain: Secondary | ICD-10-CM

## 2024-07-13 DIAGNOSIS — R1032 Left lower quadrant pain: Secondary | ICD-10-CM | POA: Diagnosis not present

## 2024-07-13 DIAGNOSIS — Z8742 Personal history of other diseases of the female genital tract: Secondary | ICD-10-CM

## 2024-07-13 DIAGNOSIS — K582 Mixed irritable bowel syndrome: Secondary | ICD-10-CM | POA: Diagnosis not present

## 2024-07-13 DIAGNOSIS — K2100 Gastro-esophageal reflux disease with esophagitis, without bleeding: Secondary | ICD-10-CM

## 2024-07-13 DIAGNOSIS — R1310 Dysphagia, unspecified: Secondary | ICD-10-CM

## 2024-07-13 DIAGNOSIS — Z1211 Encounter for screening for malignant neoplasm of colon: Secondary | ICD-10-CM

## 2024-07-13 DIAGNOSIS — Z800 Family history of malignant neoplasm of digestive organs: Secondary | ICD-10-CM

## 2024-07-13 DIAGNOSIS — K219 Gastro-esophageal reflux disease without esophagitis: Secondary | ICD-10-CM

## 2024-07-13 LAB — COMPREHENSIVE METABOLIC PANEL WITH GFR
ALT: 27 U/L (ref 0–35)
AST: 24 U/L (ref 0–37)
Albumin: 4.2 g/dL (ref 3.5–5.2)
Alkaline Phosphatase: 77 U/L (ref 39–117)
BUN: 13 mg/dL (ref 6–23)
CO2: 29 meq/L (ref 19–32)
Calcium: 9.3 mg/dL (ref 8.4–10.5)
Chloride: 100 meq/L (ref 96–112)
Creatinine, Ser: 0.84 mg/dL (ref 0.40–1.20)
GFR: 74.05 mL/min (ref >60.00)
Glucose, Bld: 111 mg/dL — ABNORMAL HIGH (ref 70–99)
Potassium: 4.3 meq/L (ref 3.5–5.1)
Sodium: 136 meq/L (ref 135–145)
Total Bilirubin: 0.3 mg/dL (ref 0.2–1.2)
Total Protein: 6.8 g/dL (ref 6.0–8.3)

## 2024-07-13 LAB — CBC WITH DIFFERENTIAL/PLATELET
Basophils Absolute: 0.1 K/uL (ref 0.0–0.1)
Basophils Relative: 0.7 % (ref 0.0–3.0)
Eosinophils Absolute: 0.1 K/uL (ref 0.0–0.7)
Eosinophils Relative: 0.7 % (ref 0.0–5.0)
HCT: 43.7 % (ref 36.0–46.0)
Hemoglobin: 14.6 g/dL (ref 12.0–15.0)
Lymphocytes Relative: 16.6 % (ref 12.0–46.0)
Lymphs Abs: 1.3 K/uL (ref 0.7–4.0)
MCHC: 33.4 g/dL (ref 30.0–36.0)
MCV: 94.9 fl (ref 78.0–100.0)
Monocytes Absolute: 0.9 K/uL (ref 0.1–1.0)
Monocytes Relative: 11.7 % (ref 3.0–12.0)
Neutro Abs: 5.4 K/uL (ref 1.4–7.7)
Neutrophils Relative %: 70.3 % (ref 43.0–77.0)
Platelets: 267 K/uL (ref 150.0–400.0)
RBC: 4.61 Mil/uL (ref 3.87–5.11)
RDW: 13 % (ref 11.5–15.5)
WBC: 7.6 K/uL (ref 4.0–10.5)

## 2024-07-13 NOTE — Telephone Encounter (Signed)
 PT is calling to ask that she have a doctors note for her appointment today. She will pick it up on Monday.

## 2024-07-13 NOTE — Patient Instructions (Signed)
 Your provider has requested that you go to the basement level for lab work before leaving today. Press B on the elevator. The lab is located at the first door on the left as you exit the elevator.  You have been scheduled for a CT scan of the abdomen and pelvis at [facility], 1st floor Radiology. You are scheduled on 07/19/24 at 8:30 am. You should arrive 15 minutes prior to your appointment time for registration.    If you have any questions regarding your exam or if you need to reschedule, you may call [facility] at [phone] between the hours of 8:00 am and 5:00 pm, Monday-Friday.    Please follow up sooner if symptoms increase or worsen  Due to recent changes in healthcare laws, you may see the results of your imaging and laboratory studies on MyChart before your provider has had a chance to review them.  We understand that in some cases there may be results that are confusing or concerning to you. Not all laboratory results come back in the same time frame and the provider may be waiting for multiple results in order to interpret others.  Please give us  48 hours in order for your provider to thoroughly review all the results before contacting the office for clarification of your results.   Thank you for trusting me with your gastrointestinal care!   Ellouise Console, PA-C _______________________________________________________  If your blood pressure at your visit was 140/90 or greater, please contact your primary care physician to follow up on this.  _______________________________________________________  If you are age 56 or older, your body mass index should be between 23-30. Your Body mass index is 23.94 kg/m. If this is out of the aforementioned range listed, please consider follow up with your Primary Care Provider.  If you are age 27 or younger, your body mass index should be between 19-25. Your Body mass index is 23.94 kg/m. If this is out of the  aformentioned range listed, please consider follow up with your Primary Care Provider.   ________________________________________________________  The [facility] providers would like to encourage you to use MYCHART to communicate with providers for non-urgent requests or questions.  Due to long hold times on the telephone, sending your provider a message by [facility] may be a faster and more efficient way to get a response.  Please allow 48 business hours for a response.  Please remember that this is for non-urgent requests.  _______________________________________________________

## 2024-07-13 NOTE — Progress Notes (Signed)
 Noted

## 2024-07-16 NOTE — Telephone Encounter (Signed)
 Inbound call from pt requesting a call from the nurse. Patient stated that she called several places to see where she can get her CT scan the quickest, pt advised me that in [place] they would be able to get her in tomorrow but they would need authorization. Please advise.

## 2024-07-16 NOTE — Telephone Encounter (Signed)
 See note below regarding auth

## 2024-07-17 ENCOUNTER — Other Ambulatory Visit (HOSPITAL_BASED_OUTPATIENT_CLINIC_OR_DEPARTMENT_OTHER): Payer: Self-pay

## 2024-07-17 ENCOUNTER — Ambulatory Visit (HOSPITAL_COMMUNITY)
Admission: RE | Admit: 2024-07-17 | Discharge: 2024-07-17 | Disposition: A | Discharge status: Home / Self Care | Source: Ambulatory Visit | Attending: Physician Assistant | Admitting: Physician Assistant

## 2024-07-17 DIAGNOSIS — R1032 Left lower quadrant pain: Secondary | ICD-10-CM | POA: Insufficient documentation

## 2024-07-17 DIAGNOSIS — R1012 Left upper quadrant pain: Secondary | ICD-10-CM | POA: Diagnosis present

## 2024-07-17 MED ORDER — IOHEXOL 300 MG/ML  SOLN
100.0000 mL | Freq: Once | INTRAMUSCULAR | Status: AC | PRN
Start: 2024-07-17 — End: 2024-07-17
  Administered 2024-07-17: 100 mL via INTRAVENOUS

## 2024-07-19 ENCOUNTER — Ambulatory Visit (HOSPITAL_COMMUNITY)

## 2024-07-23 ENCOUNTER — Encounter: Payer: Self-pay | Admitting: Internal Medicine

## 2024-08-03 ENCOUNTER — Ambulatory Visit: Payer: Self-pay | Attending: Cardiovascular Disease | Admitting: Cardiovascular Disease

## 2024-08-03 ENCOUNTER — Encounter: Payer: Self-pay | Admitting: Cardiovascular Disease

## 2024-08-03 VITALS — BP 120/94 | HR 88 | Ht 62.5 in | Wt 131.0 lb

## 2024-08-03 DIAGNOSIS — G720 Drug-induced myopathy: Secondary | ICD-10-CM

## 2024-08-03 DIAGNOSIS — T466X5A Adverse effect of antihyperlipidemic and antiarteriosclerotic drugs, initial encounter: Secondary | ICD-10-CM

## 2024-08-03 DIAGNOSIS — R931 Abnormal findings on diagnostic imaging of heart and coronary circulation: Secondary | ICD-10-CM | POA: Diagnosis not present

## 2024-08-03 DIAGNOSIS — E782 Mixed hyperlipidemia: Secondary | ICD-10-CM | POA: Diagnosis not present

## 2024-08-03 NOTE — Patient Instructions (Signed)
 Medication Instructions:  No changes *If you need a refill on your cardiac medications before your next appointment, please call your pharmacy*  Lab Work: None ordered- Notify us  a few weeks before your yearly and we will put in orders for a lipid panel to be drawn before your appointmnent If you have labs (blood work) drawn today and your tests are completely normal, you will receive your results only by: MyChart Message (if you have MyChart) OR A paper copy in the mail If you have any lab test that is abnormal or we need to change your treatment, we will call you to review the results.  Testing/Procedures: None ordered  Follow-Up: At [facility], you and your health needs are our priority.  As part of our continuing mission to provide you with exceptional heart care, our providers are all part of one team.  This team includes your primary Cardiologist (physician) and Advanced Practice Providers or APPs (Physician Assistants and Nurse Practitioners) who all work together to provide you with the care you need, when you need it.  Your next appointment:   1 year(s)  Provider:   Jerel Balding, MD    We recommend signing up for the patient portal called MyChart.  Sign up information is provided on this After Visit Summary.  MyChart is used to connect with patients for Virtual Visits (Telemedicine).  Patients are able to view lab/test results, encounter notes, upcoming appointments, etc.  Non-urgent messages can be sent to your provider as well.   To learn more about what you can do with MyChart, go to [email or web address].

## 2024-08-04 ENCOUNTER — Other Ambulatory Visit (HOSPITAL_BASED_OUTPATIENT_CLINIC_OR_DEPARTMENT_OTHER): Payer: Self-pay

## 2024-08-05 ENCOUNTER — Encounter: Payer: Self-pay | Admitting: Cardiovascular Disease
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
## 2024-08-06 ENCOUNTER — Other Ambulatory Visit (HOSPITAL_BASED_OUTPATIENT_CLINIC_OR_DEPARTMENT_OTHER): Payer: Self-pay

## 2024-08-06 MED ORDER — AMPHETAMINE-DEXTROAMPHETAMINE 20 MG PO TABS
20.0000 mg | ORAL_TABLET | Freq: Three times a day (TID) | ORAL | 0 refills | Status: DC
  Filled 2024-08-06: qty 90, 30d supply, fill #0

## 2024-08-07 ENCOUNTER — Other Ambulatory Visit (HOSPITAL_BASED_OUTPATIENT_CLINIC_OR_DEPARTMENT_OTHER): Payer: Self-pay

## 2024-08-08 ENCOUNTER — Other Ambulatory Visit: Payer: Self-pay | Admitting: Family Medicine

## 2024-08-08 DIAGNOSIS — Z1231 Encounter for screening mammogram for malignant neoplasm of breast: Secondary | ICD-10-CM

## 2024-08-24 ENCOUNTER — Encounter

## 2024-08-30 ENCOUNTER — Other Ambulatory Visit: Payer: Self-pay

## 2024-09-05 ENCOUNTER — Ambulatory Visit (AMBULATORY_SURGERY_CENTER): Payer: Self-pay

## 2024-09-05 VITALS — Ht 62.5 in | Wt 130.0 lb

## 2024-09-05 DIAGNOSIS — R1032 Left lower quadrant pain: Secondary | ICD-10-CM

## 2024-09-05 DIAGNOSIS — Z1211 Encounter for screening for malignant neoplasm of colon: Secondary | ICD-10-CM

## 2024-09-05 DIAGNOSIS — Z800 Family history of malignant neoplasm of digestive organs: Secondary | ICD-10-CM

## 2024-09-05 DIAGNOSIS — K219 Gastro-esophageal reflux disease without esophagitis: Secondary | ICD-10-CM

## 2024-09-05 DIAGNOSIS — K582 Mixed irritable bowel syndrome: Secondary | ICD-10-CM

## 2024-09-05 DIAGNOSIS — R1012 Left upper quadrant pain: Secondary | ICD-10-CM

## 2024-09-05 MED ORDER — NA SULFATE-K SULFATE-MG SULF 17.5-3.13-1.6 GM/177ML PO SOLN: 1.0000 | Freq: Once | ORAL | 0 refills | Status: AC

## 2024-09-05 NOTE — Progress Notes (Signed)
 PCP MD at time of PV: Lauran Sharps, MD  __________________________________________________________________________________________________________________________________________  No egg allergy known to patient  No soy allergy known to patient No issues known to pt with past sedation with any surgeries or procedures Patient denies ever being told they had issues or difficulty with intubation  No FH of Malignant Hyperthermia Pt is not on diet pills Pt is not on  home 02  Pt is not on blood thinners  No A fib or A flutter Have any cardiac testing pending-- no  LOA: independent  No Chew or Snuff tobacco __________________________________________________________________________________________________________________________________________  Constipation: no  Prep: suprep  __________________________________________________________________________________________________________________________________________  PV completed with patient. Prep instructions reviewed and provided during apt. Rx sent to preferred pharmacy.  __________________________________________________________________________________________________________________________________________  Patient's chart reviewed by Norleen Schillings CNRA prior to previsit and patient appropriate for the [facility].  Previsit completed and red dot placed by patient's name on their procedure day (on provider's schedule).

## 2024-09-07 ENCOUNTER — Ambulatory Visit: Admitting: Physician Assistant

## 2024-09-07 ENCOUNTER — Encounter: Payer: Self-pay | Admitting: Internal Medicine

## 2024-09-10 ENCOUNTER — Ambulatory Visit: Admitting: Physician Assistant

## 2024-09-10 NOTE — Progress Notes (Deleted)
 Angela Console, PA-C [address] [place], [place]  [address] Phone: [phone]   Primary Care Physician: Claudene Pellet, MD  Primary Gastroenterologist:  Angela Console, PA-C / ***  Chief Complaint:  ***       HPI:   Discussed the use of AI scribe software for clinical note transcription with the patient, who gave verbal consent to proceed.  63 year old female returns for 42-month follow-up of LLQ pain.  12/2023 she had laparoscopic removal of left ovarian cyst.  GYN Dr. Rigoberto.  Treated empirically with Augmentin for possible diverticulitis 07/2024.  Prior appendectomy.  History of IBS.  She has bilateral lower abdominal cramping, diarrhea alternating with constipation.  Also follow-up acid reflux, belching, and dysphagia.  Her mother had esophageal cancer.  Father had pancreatic cancer.  Patient is currently taking Nexium  40 mg daily.  07/17/2024 abdominal pelvic CT OTC with contrast: No acute abnormality.  Hepatic steatosis.  2.6 cm benign-appearing left ovarian cyst.  No follow-up imaging recommended.  No evidence of diverticulitis.  07/13/2024 labs: Normal CBC and CMP.  History of Present Illness    12/2013 last colonoscopy by Dr. Abran: Normal.  No polyps.  10-year repeat (was due 12/2023).   12/2013 EGD by Dr. Abran (for dysphagia and GERD): Normal.  Current Outpatient Medications  Medication Sig Dispense Refill   ALPRAZolam  (XANAX ) 1 MG tablet Take 1 tablet (1 mg total) by mouth 2 (two) times daily as needed. 60 tablet 2   amphetamine -dextroamphetamine  (ADDERALL) 20 MG tablet Take 1 tablet (20 mg total) by mouth 3 (three) times daily. 90 tablet 0   escitalopram (LEXAPRO) 20 MG tablet Take 10 mg by mouth daily.     esomeprazole  (NEXIUM ) 40 MG capsule Take 1 capsule (40 mg total) by mouth 2 (two) times daily. (Patient taking differently: Take 40 mg by mouth daily as needed (reflux).) 60 capsule 6   Evolocumab  (REPATHA  SURECLICK) 140 MG/ML SOAJ Inject 140 mg into the  skin every 14 (fourteen) days. 6 mL 3   fluticasone (FLONASE) 50 MCG/ACT nasal spray Place 1 spray into both nostrils daily as needed for allergies.     levothyroxine (SYNTHROID) 88 MCG tablet Take 88 mcg by mouth daily.     magnesium  oxide (MAG-OX) 400 (240 Mg) MG tablet Take 400 mg by mouth daily.     methocarbamol (ROBAXIN) 500 MG tablet Take 500-1,000 mg by mouth daily as needed for muscle spasms.     NURTEC 75 MG TBDP Take 1 tablet by mouth daily as needed.     nystatin (MYCOSTATIN) 100000 UNIT/ML suspension Take 5 mLs by mouth as needed.     oxyCODONE -acetaminophen  (PERCOCET) 10-325 MG tablet Take 1 tablet by mouth every 6 (six) hours as needed for pain. 20 tablet 0   Prasterone (INTRAROSA) 6.5 MG INST Intrarosa 6.5 mg vaginal insert (Patient not taking: Reported on 09/05/2024)     PRESCRIPTION MEDICATION Apply 1 Application topically in the morning and at bedtime. #4 AZELAIC ACID 15%/IVERMECTIN 1%/METRONIDAZOLE  1% TOPICAL GEL (PERME8/WO6 ANHYDROUS)     SUMAtriptan (IMITREX) 100 MG tablet Take 100 mg by mouth every 2 (two) hours as needed for migraine.     traMADol (ULTRAM) 50 MG tablet Take 50 mg by mouth daily as needed for moderate pain (pain score 4-6) or severe pain (pain score 7-10). (Patient not taking: Reported on 09/05/2024)     zolpidem (AMBIEN) 10 MG tablet Take 10 mg by mouth at bedtime as needed.  No current facility-administered medications for this visit.    Allergies as of 09/10/2024 - Review Complete 09/05/2024  Allergen Reaction Noted   Amoxicillin-pot clavulanate Nausea And Vomiting 01/09/2014   Escitalopram oxalate Palpitations 01/09/2014   Venlafaxine Palpitations 09/06/2013   Erythromycin Nausea And Vomiting and Nausea Only 07/24/2013   Latex Other (See Comments) and Rash 01/09/2014   Crestor  [rosuvastatin ] Other (See Comments) 07/13/2024   Evolocumab  Other (See Comments) 07/13/2024   Influenza virus vaccine Other (See Comments) 07/13/2024    Past Medical  History:  Diagnosis Date   ADHD (attention deficit hyperactivity disorder)    Anxiety    Appendicitis    Arthritis    left sided   Burning tongue syndrome 03/15/2016   Common migraine without intractability 03/15/2016   Depression    Elevated coronary artery calcium  score 04/30/2022   Follows w/ Dr.  Francyne, cardiologist.   Family history of adverse reaction to anesthesia    mother took extra time to wake up from anesthesia   Fibromyalgia    GERD (gastroesophageal reflux disease)    History of kidney stones    hx of kidney stones w/ stent placement in 2010   Hypothyroidism    Follows w/ PCP @ [facility].   Mixed hyperlipidemia    Follows w/ cardiology.   Rectocele     Past Surgical History:  Procedure Laterality Date   APPENDECTOMY     COLONOSCOPY     DILATION AND CURETTAGE OF UTERUS     LAPAROSCOPIC OVARIAN CYSTECTOMY Left 01/23/2024   Procedure: EXCISION, CYST, OVARY, LAPAROSCOPIC;  Surgeon: Mat Browning, MD;  Location: [facility] OR;  Service: Gynecology;  Laterality: Left;   LITHOTRIPSY     stent placed   WISDOM TOOTH EXTRACTION      Review of Systems:    All systems reviewed and negative except where noted in HPI.    Physical Exam:  LMP 12/20/2013  Patient's last menstrual period was 12/20/2013.  General: Well-nourished, well-developed in no acute distress.  Lungs: Clear to auscultation bilaterally. Non-labored. Heart: Regular rate and rhythm, no murmurs rubs or gallops.  Abdomen: Bowel sounds are normal; Abdomen is Soft; No hepatosplenomegaly, masses or hernias;  No Abdominal Tenderness; No guarding or rebound tenderness. Neuro: Alert and oriented x 3.  Grossly intact.  Psych: Alert and cooperative, normal mood and affect.   Imaging Studies: No results found.  Labs: CBC    Component Value Date/Time   WBC 7.6 07/13/2024 1429   RBC 4.61 07/13/2024 1429   HGB 14.6 07/13/2024 1429   HCT 43.7 07/13/2024 1429   PLT 267.0 07/13/2024 1429   MCV 94.9  07/13/2024 1429   MCH 31.2 01/20/2024 0900   MCHC 33.4 07/13/2024 1429   RDW 13.0 07/13/2024 1429   LYMPHSABS 1.3 07/13/2024 1429   MONOABS 0.9 07/13/2024 1429   EOSABS 0.1 07/13/2024 1429   BASOSABS 0.1 07/13/2024 1429    CMP     Component Value Date/Time   NA 136 07/13/2024 1429   NA 143 08/31/2022 1002   K 4.3 07/13/2024 1429   CL 100 07/13/2024 1429   CO2 29 07/13/2024 1429   GLUCOSE 111 (H) 07/13/2024 1429   BUN 13 07/13/2024 1429   BUN 15 08/31/2022 1002   CREATININE 0.84 07/13/2024 1429   CALCIUM  9.3 07/13/2024 1429   PROT 6.8 07/13/2024 1429   ALBUMIN 4.2 07/13/2024 1429   AST 24 07/13/2024 1429   ALT 27 07/13/2024 1429   ALKPHOS 77 07/13/2024 1429  BILITOT 0.3 07/13/2024 1429   GFRNONAA >60 01/20/2024 0900   GFRAA  07/30/2010 0104    >60        The eGFR has been calculated using the MDRD equation. This calculation has not been validated in all clinical situations. eGFR's persistently <60 mL/min signify possible Chronic Kidney Disease.       Assessment and Plan:   Angela Good is a 63 y.o. y/o female   1.  Irritable bowel syndrome with abdominal cramping, diarrhea, constipation.  Recent abdominal pelvic CT showed no evidence of diverticulitis or other acute abnormality to explain abdominal pain. - Rx dicyclomine - Low FODMAP diet - IBgard  2.  Colon cancer screening: She is due for a 10-year repeat screening colonoscopy - Scheduling Colonoscopy I discussed risks of colonoscopy with patient to include risk of bleeding, colon perforation, and risk of sedation.  Patient expressed understanding and agrees to proceed with colonoscopy.   3.  Chronic GERD with vague dysphagia symptoms -Continue Nexium  40 mg daily - Schedule EGD with possible dilation with Dr. Abran - Scheduling EGD I discussed risks of EGD with patient to include risk of bleeding, perforation, and risk of sedation.  Patient expressed understanding and agrees to proceed with EGD.    Assessment and Plan Assessment & Plan       Angela Console, PA-C  Follow up ***

## 2024-09-11 ENCOUNTER — Other Ambulatory Visit (HOSPITAL_BASED_OUTPATIENT_CLINIC_OR_DEPARTMENT_OTHER): Payer: Self-pay

## 2024-09-12 ENCOUNTER — Ambulatory Visit
Admission: RE | Admit: 2024-09-12 | Discharge: 2024-09-12 | Disposition: A | Discharge status: Home / Self Care | Source: Ambulatory Visit | Attending: Family Medicine | Admitting: Family Medicine

## 2024-09-12 DIAGNOSIS — Z1231 Encounter for screening mammogram for malignant neoplasm of breast: Secondary | ICD-10-CM

## 2024-09-14 ENCOUNTER — Ambulatory Visit (AMBULATORY_SURGERY_CENTER): Admitting: Internal Medicine

## 2024-09-14 ENCOUNTER — Encounter: Payer: Self-pay | Admitting: Internal Medicine

## 2024-09-14 VITALS — BP 154/94 | HR 71 | Temp 98.1°F | Resp 22 | Ht 62.5 in | Wt 130.0 lb

## 2024-09-14 DIAGNOSIS — K2100 Gastro-esophageal reflux disease with esophagitis, without bleeding: Secondary | ICD-10-CM

## 2024-09-14 DIAGNOSIS — R1012 Left upper quadrant pain: Secondary | ICD-10-CM

## 2024-09-14 DIAGNOSIS — R1032 Left lower quadrant pain: Secondary | ICD-10-CM

## 2024-09-14 DIAGNOSIS — K219 Gastro-esophageal reflux disease without esophagitis: Secondary | ICD-10-CM

## 2024-09-14 DIAGNOSIS — D122 Benign neoplasm of ascending colon: Secondary | ICD-10-CM | POA: Diagnosis not present

## 2024-09-14 DIAGNOSIS — Z860100 Personal history of colon polyps, unspecified: Secondary | ICD-10-CM

## 2024-09-14 DIAGNOSIS — K582 Mixed irritable bowel syndrome: Secondary | ICD-10-CM

## 2024-09-14 DIAGNOSIS — K449 Diaphragmatic hernia without obstruction or gangrene: Secondary | ICD-10-CM

## 2024-09-14 DIAGNOSIS — Z1211 Encounter for screening for malignant neoplasm of colon: Secondary | ICD-10-CM

## 2024-09-14 MED ORDER — SODIUM CHLORIDE 0.9 % IV SOLN: 500.0000 mL | Freq: Once | INTRAVENOUS | Status: DC

## 2024-09-14 NOTE — Patient Instructions (Addendum)
 -  Resume previous diet. - Continue present medications. - Await pathology results.  YOU HAD AN ENDOSCOPIC PROCEDURE TODAY AT THE [facility]:   Refer to the procedure report that was given to you for any specific questions about what was found during the examination.  If the procedure report does not answer your questions, please call your gastroenterologist to clarify.  If you requested that your care partner not be given the details of your procedure findings, then the procedure report has been included in a sealed envelope for you to review at your convenience later.  YOU SHOULD EXPECT: Some feelings of bloating in the abdomen. Passage of more gas than usual.  Walking can help get rid of the air that was put into your GI tract during the procedure and reduce the bloating. If you had a lower endoscopy (such as a colonoscopy or flexible sigmoidoscopy) you may notice spotting of blood in your stool or on the toilet paper. If you underwent a bowel prep for your procedure, you may not have a normal bowel movement for a few days.  Please Note:  You might notice some irritation and congestion in your nose or some drainage.  This is from the oxygen used during your procedure.  There is no need for concern and it should clear up in a day or so.  SYMPTOMS TO REPORT IMMEDIATELY:  Following lower endoscopy (colonoscopy or flexible sigmoidoscopy):  Excessive amounts of blood in the stool  Significant tenderness or worsening of abdominal pains  Swelling of the abdomen that is new, acute  Fever of 100F or higher  Following upper endoscopy (EGD)  Vomiting of blood or coffee ground material  New chest pain or pain under the shoulder blades  Painful or persistently difficult swallowing  New shortness of breath  Fever of 100F or higher  Black, tarry-looking stools  For urgent or emergent issues, a gastroenterologist can be reached at any hour by calling [phone]. Do not use MyChart  messaging for urgent concerns.    DIET:  We do recommend a small meal at first, but then you may proceed to your regular diet.  Drink plenty of fluids but you should avoid alcoholic beverages for 24 hours.  ACTIVITY:  You should plan to take it easy for the rest of today and you should NOT DRIVE or use heavy machinery until tomorrow (because of the sedation medicines used during the test).    FOLLOW UP: Our staff will call the number listed on your records the next business day following your procedure.  We will call around 7:15- 8:00 am to check on you and address any questions or concerns that you may have regarding the information given to you following your procedure. If we do not reach you, we will leave a message.     If any biopsies were taken you will be contacted by phone or by letter within the next 1-3 weeks.  Please call us at [phone] if you have not heard about the biopsies in 3 weeks.    SIGNATURES/CONFIDENTIALITY: You and/or your care partner have signed paperwork which will be entered into your electronic medical record.  These signatures attest to the fact that that the information above on your After Visit Summary has been reviewed and is understood.  Full responsibility of the confidentiality of this discharge information lies with you and/or your care-partner.

## 2024-09-14 NOTE — Op Note (Signed)
 [facility] Patient Name: Angela Good Procedure Date: 09/14/2024 1:32 PM MRN: [id number] Endoscopist: Norleen SAILOR. Abran , MD, [id number] Age: 63 Referring MD:  Date of Birth: 11-Jul-1961 Gender: Female Account #: [id number] Procedure:                Upper GI endoscopy with biopsy Indications:              Abdominal pain, Esophageal reflux Medicines:                Monitored Anesthesia Care Procedure:                Pre-Anesthesia Assessment:                           - Prior to the procedure, a History and Physical                            was performed, and patient medications and                            allergies were reviewed. The patient's tolerance of                            previous anesthesia was also reviewed. The risks                            and benefits of the procedure and the sedation                            options and risks were discussed with the patient.                            All questions were answered, and informed consent                            was obtained. Prior Anticoagulants: The patient has                            taken no anticoagulant or antiplatelet agents. ASA                            Grade Assessment: II - A patient with mild systemic                            disease. After reviewing the risks and benefits,                            the patient was deemed in satisfactory condition to                            undergo the procedure.                           After obtaining informed consent, the endoscope was  passed under direct vision. Throughout the                            procedure, the patient's blood pressure, pulse, and                            oxygen saturations were monitored continuously. The                            Olympus Scope [id number] was introduced through the                            mouth, and advanced to the second part of duodenum.                            The  upper GI endoscopy was accomplished without                            difficulty. The patient tolerated the procedure                            well. Scope In: Scope Out: Findings:                 The esophagus was essentially normal. There was                            mild focal inflammation at the junction of the                            proximal gastric fold and esophagus (GE junction)                            this was biopsied with a cold forceps for histology.                           The stomach was normal. Small hiatal hernia.                           The examined duodenum was normal.                           The cardia and gastric fundus were normal on                            retroflexion. Complications:            No immediate complications. Estimated Blood Loss:     Estimated blood loss: none. Impression:               - Normal esophagus with mild focal inflammation.                            Biopsied.                           -  Normal stomach with small hiatal hernia.                           - Normal examined duodenum. Recommendation:           - Patient has a contact number available for                            emergencies. The signs and symptoms of potential                            delayed complications were discussed with the                            patient. Return to normal activities tomorrow.                            Written discharge instructions were provided to the                            patient.                           - Resume previous diet.                           - Continue present medications.                           - Await pathology results. Norleen SAILOR. Abran, MD 09/14/2024 2:43:15 PM This report has been signed electronically.

## 2024-09-14 NOTE — Progress Notes (Signed)
 Pt's states no medical or surgical changes since previsit or office visit.

## 2024-09-14 NOTE — Progress Notes (Signed)
 Called to room to assist during endoscopic procedure.  Patient ID and intended procedure confirmed with present staff. Received instructions for my participation in the procedure from the performing physician.

## 2024-09-14 NOTE — Progress Notes (Signed)
 Pt sedate, gd SR's, VSS, report to RN

## 2024-09-14 NOTE — Op Note (Signed)
 [facility] Patient Name: Angela Good Procedure Date: 09/14/2024 1:33 PM MRN: [id number] Endoscopist: Norleen SAILOR. Abran , MD, [id number] Age: 63 Referring MD:  Date of Birth: [birth year] Gender: Female Account #: [id number] Procedure:                Colonoscopy with cold snare polypectomy x 1 Indications:              Screening for colorectal malignant neoplasm.                            Previous exam 2015 was negative for neoplasia Medicines:                Monitored Anesthesia Care Procedure:                Pre-Anesthesia Assessment:                           - Prior to the procedure, a History and Physical                            was performed, and patient medications and                            allergies were reviewed. The patient's tolerance of                            previous anesthesia was also reviewed. The risks                            and benefits of the procedure and the sedation                            options and risks were discussed with the patient.                            All questions were answered, and informed consent                            was obtained. Prior Anticoagulants: The patient has                            taken no anticoagulant or antiplatelet agents. ASA                            Grade Assessment: II - A patient with mild systemic                            disease. After reviewing the risks and benefits,                            the patient was deemed in satisfactory condition to                            undergo the procedure.  After obtaining informed consent, the colonoscope                            was passed under direct vision. Throughout the                            procedure, the patient's blood pressure, pulse, and                            oxygen saturations were monitored continuously. The                            Olympus Scope SN [id number] was introduced through the                             anus and advanced to the the cecum, identified by                            appendiceal orifice and ileocecal valve. The                            ileocecal valve, appendiceal orifice, and rectum                            were photographed. The quality of the bowel                            preparation was excellent. The colonoscopy was                            performed without difficulty. The patient tolerated                            the procedure well. The bowel preparation used was                            SUPREP via split dose instruction. Scope In: 2:00:39 PM Scope Out: 2:12:42 PM Scope Withdrawal Time: 0 hours 9 minutes 5 seconds  Total Procedure Duration: 0 hours 12 minutes 3 seconds  Findings:                 A 4 mm polyp was found in the ascending colon. The                            polyp was removed with a cold snare. Resection and                            retrieval were complete.                           There was an incidental web in the cecum. The                            rectosigmoid region was somewhat  fixed. The exam                            was otherwise without abnormality on direct and                            retroflexion views. Complications:            No immediate complications. Estimated blood loss:                            None. Estimated Blood Loss:     Estimated blood loss: none. Impression:               - One 4 mm polyp in the ascending colon, removed                            with a cold snare. Resected and retrieved.                           - The examination was otherwise normal on direct                            and retroflexion views. Recommendation:           - Repeat colonoscopy in 7-10 years for surveillance.                           - Patient has a contact number available for                            emergencies. The signs and symptoms of potential                            delayed complications were  discussed with the                            patient. Return to normal activities tomorrow.                            Written discharge instructions were provided to the                            patient.                           - Resume previous diet.                           - Continue present medications. Norleen SAILOR. Abran, MD 09/14/2024 2:39:27 PM This report has been signed electronically.

## 2024-09-14 NOTE — Progress Notes (Signed)
 Expand All Collapse All       Ellouise Console, PA-C [address] [place], [place]  [address] Phone: [phone]   Gastroenterology Consultation   Referring Provider:     Claudene Pellet, MD Primary Care Physician:  Claudene Pellet, MD Primary Gastroenterologist:  Ellouise Console, PA-C / Norleen Kiang, MD  Reason for Consultation:     Left lower quadrant abdominal pain        HPI:   Angela Good is a 63 y.o. y/o female referred for consultation & management  by Claudene Pellet, MD. She is here today with her husband.  PCP Dr. Pellet Claudene.  GYN Dr. Raguel.   Patient was evaluated by her GYN Dr. Rosaline Cobble.  Diagnosed with 5.6 cm left ovarian cyst.  Underwent laparoscopy and removal of left adnexal cyst 01/23/2024.  She has had persistent abdominal discomfort and bloating since surgery.  In the past few weeks she was diagnosed with possible diverticulitis and was treated with Augmentin which caused nausea and vomiting.  She was switched to metronidazole  and doxycycline in the past week.  She continues to have persistent LLQ and LUQ abdominal pain.  Has also had some left mid back pain for 2 days.  Has episodes of diarrhea alternating with constipation.  Denies melena or hematochezia.  She has been on liquid diet.  She is worried about persistent diverticulitis..  She has had previous appendectomy.  History of IBS.   Patient also has episodes of acid reflux, belching, and dysphagia.  She is requesting to schedule EGD.  Family history significant for her mother who had esophageal cancer.  Father had pancreatic cancer.  1 Sister had breast cancer and another sister had lymphoma.  Patient is currently taking Nexium  40 mg daily with occasional breakthrough GERD symptoms.  Currently has upper respiratory infection.  Currently wearing a mask.   No recent abdominal pelvic CT.   12/2013 last colonoscopy by Dr. Kiang: Normal.  No polyps.  10-year repeat (was due 12/2023).   12/2013 EGD by Dr. Kiang  (for dysphagia and GERD): Normal.       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  Francyne, cardiologist.   Family history of adverse reaction to anesthesia      mother took extra time to wake up from anesthesia   Fibromyalgia     GERD (gastroesophageal reflux disease)     History of kidney stones      hx of kidney stones w/ stent placement in 2010   Hypothyroidism      Follows w/ PCP @ [facility].   Mixed hyperlipidemia      Follows w/ cardiology.   Rectocele                 Past Surgical History:  Procedure Laterality Date   APPENDECTOMY       COLONOSCOPY       DILATION AND CURETTAGE OF UTERUS       LAPAROSCOPIC OVARIAN CYSTECTOMY Left 01/23/2024    Procedure: EXCISION, CYST, OVARY, LAPAROSCOPIC;  Surgeon: Cobble Rosaline, MD;  Location: [facility] OR;  Service: Gynecology;  Laterality: Left;   LITHOTRIPSY  stent placed   WISDOM TOOTH EXTRACTION                     Prior to Admission medications   Medication Sig Start Date End Date Taking? Authorizing Provider  ALPRAZolam  (XANAX ) 1 MG tablet Take 1 tablet (1 mg total) by mouth 2 (two) times daily as needed. 05/07/24        amphetamine -dextroamphetamine  (ADDERALL) 20 MG tablet Take 1 tablet (20 mg total) by mouth 3 (three) times daily. 03/29/24        escitalopram (LEXAPRO) 20 MG tablet Take 10 mg by mouth daily.       [provider]  esomeprazole  (NEXIUM ) 40 MG capsule Take 1 capsule (40 mg total) by mouth 2 (two) times daily. Patient taking differently: Take 40 mg by mouth daily as needed (reflux). 09/11/13     Abran Norleen SAILOR, MD  Evolocumab  (REPATHA  SURECLICK) 140 MG/ML SOAJ Inject 140 mg into the skin every 14 (fourteen)  days. 11/16/23     Croitoru, Mihai, MD  fluticasone (FLONASE) 50 MCG/ACT nasal spray Place 1 spray into both nostrils daily as needed for allergies. 08/05/15     [provider]  levothyroxine (SYNTHROID) 88 MCG tablet Take 88 mcg by mouth daily. 03/22/22     [provider]  methocarbamol (ROBAXIN) 500 MG tablet Take 500-1,000 mg by mouth daily as needed for muscle spasms.       [provider]  oxyCODONE -acetaminophen  (PERCOCET) 10-325 MG tablet Take 1 tablet by mouth every 6 (six) hours as needed for pain. 01/23/24 01/22/25   Mat Browning, MD  Prasterone (INTRAROSA) 6.5 MG INST Intrarosa 6.5 mg vaginal insert Patient not taking: Reported on 04/22/2023       [provider]  PRESCRIPTION MEDICATION Apply 1 Application topically in the morning and at bedtime. #4 AZELAIC ACID 15%/IVERMECTIN 1%/METRONIDAZOLE  1% TOPICAL GEL (PERME8/WO6 ANHYDROUS)       [provider]  SUMAtriptan (IMITREX) 100 MG tablet Take 100 mg by mouth every 2 (two) hours as needed for migraine.       [provider]  traMADol (ULTRAM) 50 MG tablet Take 50 mg by mouth daily as needed for moderate pain (pain score 4-6) or severe pain (pain score 7-10). 11/01/23     [provider]  zolpidem (AMBIEN) 10 MG tablet Take 10 mg by mouth at bedtime as needed. 01/18/24     [provider]           Family History  Problem Relation Age of Onset   Esophageal cancer Mother     Pancreatic cancer Father     Breast cancer Sister     Heart disease Brother     Colon cancer Neg Hx     Rectal cancer Neg Hx     Stomach cancer Neg Hx            Social History  Social History         Tobacco Use   Smoking status: Never   Smokeless tobacco: Never  Vaping Use   Vaping status: Never Used  Substance Use Topics   Alcohol use: Yes      Alcohol/week: 2.0 standard drinks of alcohol      Types: 2 Glasses of wine per week   Drug use: No             Allergies as of  07/13/2024 - Review Complete 07/13/2024  Allergen Reaction Noted   Amoxicillin-pot clavulanate  Nausea And Vomiting 01/09/2014   Escitalopram oxalate Palpitations 01/09/2014   Venlafaxine Palpitations 09/06/2013   Erythromycin Nausea And Vomiting and Nausea Only 07/24/2013   Latex Other (See Comments) and Rash 01/09/2014   Crestor  [rosuvastatin ] Other (See Comments) 07/13/2024   Evolocumab    07/13/2024   Influenza virus vaccine Other (See Comments) 07/13/2024      Review of Systems:    All systems reviewed and negative except where noted in HPI.    Physical Exam:  BP 130/70   Pulse 95   Ht 5' 2.5 (1.588 m)   Wt 133 lb (60.3 kg)   LMP 12/20/2013   BMI 23.94 kg/m  Patient's last menstrual period was 12/20/2013.   General:   Alert,  Well-developed, well-nourished, pleasant and cooperative in NAD Lungs:  Respirations even and unlabored.  Clear throughout to auscultation.   No wheezes, crackles, or rhonchi. No acute distress. Heart:  Regular rate and rhythm; no murmurs, clicks, rubs, or gallops. Abdomen:  Normal bowel sounds.  No bruits.  Soft, and non-distended without masses, hepatosplenomegaly or hernias noted.  Mild to moderate LUQ and LLQ tenderness.  No right sided abdominal tenderness.  No guarding or rebound tenderness.    Neurologic:  Alert and oriented x3;  grossly normal neurologically. Psych:  Alert and cooperative.  Anxious mood and affect.   Imaging Studies: Imaging Results  No results found.     Labs: CBC Labs (Brief)          Component Value Date/Time    WBC 7.6 07/13/2024 1429    RBC 4.61 07/13/2024 1429    HGB 14.6 07/13/2024 1429    HCT 43.7 07/13/2024 1429    PLT 267.0 07/13/2024 1429    MCV 94.9 07/13/2024 1429        CMP     Labs (Brief)           Component Value Date/Time    NA 136 07/13/2024 1429    NA 143 08/31/2022 1002    K 4.3 07/13/2024 1429    CL 100 07/13/2024 1429    CO2 29 07/13/2024 1429    GLUCOSE 111 (H) 07/13/2024 1429     BUN 13 07/13/2024 1429    BUN 15 08/31/2022 1002    CREATININE 0.84 07/13/2024 1429    CALCIUM  9.3 07/13/2024 1429    PROT 6.8 07/13/2024 1429    ALBUMIN 4.2 07/13/2024 1429    AST 24 07/13/2024 1429    ALT 27 07/13/2024 1429    ALKPHOS 77 07/13/2024 1429    BILITOT 0.3 07/13/2024 1429    GFRNONAA >60 01/20/2024 0900    GFRAA   07/30/2010 0104      >60        The eGFR has been calculated using the MDRD equation. This calculation has not been validated in all clinical situations. eGFR's persistently <60 mL/min signify possible Chronic Kidney Disease.        Assessment and Plan:    Angela Good is a 63 y.o. y/o female has been referred for a multitude of GI symptoms.  I am most suspicious for irritable bowel syndrome and acid reflux.  Diverticulitis is in the differential.  She has family history of mom with esophageal cancer and father with pancreatic cancer.  Currently due for 10-year repeat screening colonoscopy.  I am ordering abdominal pelvic CT to evaluate for diverticulitis and screen for pancreatic cancer.  If CT is unrevealing, then we will go ahead and schedule repeat screening colonoscopy  and EGD.  Continue treatment for IBS and GERD.   1.  LLQ and LUQ abdominal pain; evaluate for diverticulitis - Labs CBC, CMP - CT abdomen pelvis with contrast   2.  History of left ovarian cyst s/p surgical removal 12/2023 by Dr. Mat   3.  Colon cancer screening - If abdominal pelvic CT shows no diverticulitis, then plan to schedule 10-year repeat screening colonoscopy with Dr. Abran in [facility].   4.  Chronic intermittent GERD with vague dysphagia symptoms - If CT is unrevealing, then schedule EGD with Dr. Abran. - Continue Nexium  40 mg once daily. - GERD diet.   5.  Family history of mother with esophageal cancer. - If abdominal CT is negative, then schedule EGD with Dr. Abran in [facility].   6.  Family history of father with pancreatic cancer. - Schedule abdominal CT.   7.   Irritable bowel syndrome with diarrhea and constipation - Pending CT results, then decide about further treatment.   Follow up based on test results and GI symptoms.    HPI as above.  No interval change.  Negative CT.  Negative labs.  Now for colonoscopy and upper endoscopy.

## 2024-09-17 ENCOUNTER — Telehealth: Payer: Self-pay | Admitting: *Deleted

## 2024-09-17 NOTE — Telephone Encounter (Signed)
 No answer follow up call. Left a message.

## 2024-09-19 ENCOUNTER — Ambulatory Visit: Payer: Self-pay | Admitting: Internal Medicine

## 2024-09-19 ENCOUNTER — Other Ambulatory Visit (HOSPITAL_BASED_OUTPATIENT_CLINIC_OR_DEPARTMENT_OTHER): Payer: Self-pay

## 2024-09-19 LAB — SURGICAL PATHOLOGY

## 2024-09-20 ENCOUNTER — Other Ambulatory Visit (HOSPITAL_BASED_OUTPATIENT_CLINIC_OR_DEPARTMENT_OTHER): Payer: Self-pay

## 2024-09-20 MED ORDER — ALPRAZOLAM 1 MG PO TABS
1.0000 mg | ORAL_TABLET | Freq: Two times a day (BID) | ORAL | 2 refills | Status: AC | PRN
  Filled 2024-10-12: qty 60, 30d supply, fill #0

## 2024-10-12 ENCOUNTER — Other Ambulatory Visit: Payer: Self-pay

## 2024-10-12 ENCOUNTER — Other Ambulatory Visit (HOSPITAL_BASED_OUTPATIENT_CLINIC_OR_DEPARTMENT_OTHER): Payer: Self-pay

## 2024-10-12 ENCOUNTER — Other Ambulatory Visit: Payer: Self-pay | Admitting: Cardiovascular Disease

## 2024-10-12 DIAGNOSIS — E782 Mixed hyperlipidemia: Secondary | ICD-10-CM

## 2024-10-12 DIAGNOSIS — R931 Abnormal findings on diagnostic imaging of heart and coronary circulation: Secondary | ICD-10-CM

## 2024-10-12 MED ORDER — AMPHETAMINE-DEXTROAMPHETAMINE 20 MG PO TABS
20.0000 mg | ORAL_TABLET | Freq: Three times a day (TID) | ORAL | 0 refills | Status: AC
  Filled 2024-10-12: qty 90, 30d supply, fill #0

## 2024-10-12 MED ORDER — REPATHA SURECLICK 140 MG/ML ~~LOC~~ SOAJ
140.0000 mg | SUBCUTANEOUS | 3 refills | Status: AC
  Filled 2024-10-12: qty 2, 28d supply, fill #0
  Filled 2024-11-16: qty 2, 28d supply, fill #1

## 2024-11-16 ENCOUNTER — Other Ambulatory Visit (HOSPITAL_BASED_OUTPATIENT_CLINIC_OR_DEPARTMENT_OTHER): Payer: Self-pay
